# Patient Record
Sex: Female | Born: 1975
Health system: Southern US, Community
[De-identification: ages and names within clinical notes are randomized; demographics above are authoritative.]

## PROBLEM LIST (undated history)

## (undated) DIAGNOSIS — F419 Anxiety disorder, unspecified: Secondary | ICD-10-CM

## (undated) DIAGNOSIS — R51 Headache: Secondary | ICD-10-CM

## (undated) DIAGNOSIS — E282 Polycystic ovarian syndrome: Secondary | ICD-10-CM

## (undated) DIAGNOSIS — K8020 Calculus of gallbladder without cholecystitis without obstruction: Secondary | ICD-10-CM

## (undated) DIAGNOSIS — K219 Gastro-esophageal reflux disease without esophagitis: Secondary | ICD-10-CM

## (undated) DIAGNOSIS — Z87442 Personal history of urinary calculi: Secondary | ICD-10-CM

## (undated) DIAGNOSIS — G8929 Other chronic pain: Secondary | ICD-10-CM

## (undated) DIAGNOSIS — E079 Disorder of thyroid, unspecified: Secondary | ICD-10-CM

## (undated) HISTORY — DX: Gastro-esophageal reflux disease without esophagitis: K21.9

## (undated) HISTORY — DX: Headache: R51

## (undated) HISTORY — DX: Calculus of gallbladder without cholecystitis without obstruction: K80.20

## (undated) HISTORY — DX: Polycystic ovarian syndrome: E28.2

## (undated) HISTORY — DX: Disorder of thyroid, unspecified: E07.9

## (undated) HISTORY — DX: Personal history of urinary calculi: Z87.442

## (undated) HISTORY — DX: Other chronic pain: G89.29

## (undated) HISTORY — DX: Anxiety disorder, unspecified: F41.9

## (undated) HISTORY — PX: CHOLECYSTECTOMY: SHX55

---

## 1997-12-29 ENCOUNTER — Encounter: Admission: RE | Admit: 1997-12-29 | Discharge: 1998-03-29 | Payer: Self-pay | Admitting: Family Medicine

## 1998-11-23 ENCOUNTER — Other Ambulatory Visit: Admission: RE | Admit: 1998-11-23 | Discharge: 1998-11-23 | Payer: Self-pay | Admitting: Obstetrics and Gynecology

## 1998-11-30 ENCOUNTER — Ambulatory Visit (HOSPITAL_COMMUNITY): Admission: RE | Admit: 1998-11-30 | Discharge: 1998-11-30 | Payer: Self-pay | Admitting: *Deleted

## 1998-11-30 ENCOUNTER — Encounter: Payer: Self-pay | Admitting: *Deleted

## 1998-12-12 ENCOUNTER — Ambulatory Visit (HOSPITAL_COMMUNITY): Admission: RE | Admit: 1998-12-12 | Discharge: 1998-12-12 | Payer: Self-pay | Admitting: *Deleted

## 2001-03-25 ENCOUNTER — Other Ambulatory Visit: Admission: RE | Admit: 2001-03-25 | Discharge: 2001-03-25 | Payer: Self-pay | Admitting: Obstetrics and Gynecology

## 2002-06-04 ENCOUNTER — Other Ambulatory Visit: Admission: RE | Admit: 2002-06-04 | Discharge: 2002-06-04 | Payer: Self-pay | Admitting: Obstetrics and Gynecology

## 2002-09-10 ENCOUNTER — Emergency Department (HOSPITAL_COMMUNITY): Admission: EM | Admit: 2002-09-10 | Discharge: 2002-09-10 | Payer: Self-pay | Admitting: Emergency Medicine

## 2002-09-10 ENCOUNTER — Encounter: Payer: Self-pay | Admitting: Emergency Medicine

## 2003-06-16 ENCOUNTER — Other Ambulatory Visit: Admission: RE | Admit: 2003-06-16 | Discharge: 2003-06-16 | Payer: Self-pay | Admitting: Obstetrics and Gynecology

## 2004-06-21 ENCOUNTER — Other Ambulatory Visit: Admission: RE | Admit: 2004-06-21 | Discharge: 2004-06-21 | Payer: Self-pay | Admitting: Obstetrics and Gynecology

## 2005-05-21 ENCOUNTER — Other Ambulatory Visit: Admission: RE | Admit: 2005-05-21 | Discharge: 2005-05-21 | Payer: Self-pay | Admitting: Gynecology

## 2005-09-27 ENCOUNTER — Other Ambulatory Visit: Admission: RE | Admit: 2005-09-27 | Discharge: 2005-09-27 | Payer: Self-pay | Admitting: Obstetrics and Gynecology

## 2005-11-21 ENCOUNTER — Ambulatory Visit (HOSPITAL_COMMUNITY): Admission: RE | Admit: 2005-11-21 | Discharge: 2005-11-21 | Payer: Self-pay | Admitting: Obstetrics and Gynecology

## 2005-11-21 ENCOUNTER — Emergency Department (HOSPITAL_COMMUNITY): Admission: EM | Admit: 2005-11-21 | Discharge: 2005-11-21 | Payer: Self-pay | Admitting: Emergency Medicine

## 2006-06-23 ENCOUNTER — Emergency Department (HOSPITAL_COMMUNITY): Admission: EM | Admit: 2006-06-23 | Discharge: 2006-06-23 | Payer: Self-pay | Admitting: Family Medicine

## 2006-08-06 ENCOUNTER — Ambulatory Visit: Payer: Self-pay | Admitting: Gastroenterology

## 2006-12-26 ENCOUNTER — Emergency Department (HOSPITAL_COMMUNITY): Admission: EM | Admit: 2006-12-26 | Discharge: 2006-12-26 | Payer: Self-pay | Admitting: Emergency Medicine

## 2007-03-04 ENCOUNTER — Encounter: Admission: RE | Admit: 2007-03-04 | Discharge: 2007-03-04 | Payer: Self-pay | Admitting: Obstetrics and Gynecology

## 2008-03-30 ENCOUNTER — Inpatient Hospital Stay (HOSPITAL_COMMUNITY): Admission: AD | Admit: 2008-03-30 | Discharge: 2008-05-18 | Payer: Self-pay | Admitting: Obstetrics and Gynecology

## 2008-04-27 ENCOUNTER — Encounter: Payer: Self-pay | Admitting: Obstetrics and Gynecology

## 2008-05-04 ENCOUNTER — Encounter: Payer: Self-pay | Admitting: Obstetrics and Gynecology

## 2008-05-11 ENCOUNTER — Encounter: Payer: Self-pay | Admitting: Obstetrics and Gynecology

## 2008-05-18 ENCOUNTER — Encounter: Payer: Self-pay | Admitting: Obstetrics and Gynecology

## 2008-05-25 ENCOUNTER — Ambulatory Visit (HOSPITAL_COMMUNITY): Admission: RE | Admit: 2008-05-25 | Discharge: 2008-05-25 | Payer: Self-pay | Admitting: Obstetrics and Gynecology

## 2008-06-02 ENCOUNTER — Inpatient Hospital Stay (HOSPITAL_COMMUNITY): Admission: RE | Admit: 2008-06-02 | Discharge: 2008-06-06 | Payer: Self-pay | Admitting: Obstetrics and Gynecology

## 2008-06-02 ENCOUNTER — Encounter (INDEPENDENT_AMBULATORY_CARE_PROVIDER_SITE_OTHER): Payer: Self-pay | Admitting: Obstetrics and Gynecology

## 2010-07-08 ENCOUNTER — Encounter: Payer: Self-pay | Admitting: Obstetrics and Gynecology

## 2010-10-30 NOTE — Op Note (Signed)
Vanessa [name], Vanessa [name]             ACCOUNT NO.:  [id number]   MEDICAL RECORD NO.:  [id number]          PATIENT TYPE:  INP   LOCATION:  9139                          FACILITY:  [facility]   PHYSICIAN:  Michelle L. Grewal, M.D.DATE OF BIRTH:  September 01, 1975   DATE OF PROCEDURE:  DATE OF DISCHARGE:                               OPERATIVE REPORT   PREOPERATIVE DIAGNOSES:  Intrauterine pregnancy at 36 weeks, twins, twin  A breech, and advanced dilation.   POSTOPERATIVE DIAGNOSES:  Intrauterine pregnancy at 36 weeks, twins,  twin A breech, and advanced dilation.   PROCEDURE:  Primary low-transverse cesarean section.   SURGEON:  Michelle L. Vincente Poli, MD   ASSISTANT:  Duke Salvia. Marcelle Overlie, MD   ANESTHESIA:  Spinal.   ESTIMATED BLOOD LOSS:  500 mL.   COMPLICATIONS:  None.   SPECIMENS:  Placenta sent to Pathology x2.   PROCEDURE:  The patient is a 35 year old gravida 1, para 0 at 36 weeks'  gestation.  She conceived by IVF.  We have been following her for  preterm dilation.  She was in the hospital for an extensive period of  time because of advanced cervical dilation.  On Monday, she presented to  the office.  She was about 3-4 cm dilated with a breech very low in the  pelvis.  We discussed her options and because of her morbid obesity, we  elected to schedule C-section.  The patient presented today for  scheduled C-section.  She reports that this morning she passed a mucus  plug and noticed intense contractions and more pressure.   Exam today, after she received her spinal, she was actually 5 cm plus  with a bulging bag.   The patient was taken to the operating room and she was prepped and  draped in usual sterile fashion.  Spinal was placed without incident  initially without any problem.  A Foley catheter was placed and drained  clear urine.  Her pannus was taped up using, prior to prepping, the  usual surgical tape.  With good exposure, we made a low transverse  incision and carried that  down through the subcutaneous layer and  through the fascia.  Fascia scored in midline and extended laterally.  The rectus muscles were separated in the midline and the peritoneum was  entered bluntly.  The peritoneal incision was then stretched.  The lower  uterine segment was identified.  It was very thinned out.  Low-  transverse incision was made in the uterus.  It was scored one time and  then entered easily with a hemostat.  The amniotic fluid was clear.  The  baby was in frank breech and was delivered.  There was some difficulty  because the baby's head was hyperextended; however, we were able to  rotate the baby's head easily and then deliver the baby.  The baby was a  female infant, Apgar 7 at one minute and 8 at five minutes.  The cord  was clamped and cut and the baby was taken to the neonatal team.  I then  pushed down on the uterus and the sac for B  was bulging up near the  incision and around the sac, the fluid was clear.  The baby was in  vertex position and vacuum was applied easily to the head and the baby  was delivered easily with one pull of the vacuum, no pop-off.  The baby  was a female infant.  Apgars 7 at one minute and 8 at five minutes.  The  cord was clamped and cut.  The placentas were manually removed after  cord blood was obtained and cord pH was obtained and they were noted to  be unremarkable and they were sent to Pathology.  The uterus was  exteriorized and cleared of all clots and debris.  The uterine incision  was closed in 2 layers using 0-chromic in a running locked stitch.  The  uterus was returned to the abdomen.  Irrigation was performed and the  incision was inspected and noted be hemostatic.  The peritoneum was  closed using 0-Vicryl.  The rectus muscles were reapproximated using 0-  Vicryl.  The fascia was closed using 0-Vicryl in a running stitch.  After irrigation of subcutaneous layer, the subcutaneous layer was  closed with plain gut suture  interrupted and the skin was closed with  staples.  All sponge, lap, and instrument counts were correct x2.  The  patient went to recovery room in stable condition.      Michelle L. Vincente Poli, M.D.  Electronically Signed     MLG/MEDQ  D:  06/02/2008  T:  06/03/2008  Job:  [id number]

## 2010-10-30 NOTE — H&P (Signed)
NAME:  Vanessa [name], Vanessa [name]            ACCOUNT NO.:  [id number]   MEDICAL RECORD NO.:  [id number]          PATIENT TYPE:   LOCATION:                                 FACILITY:   PHYSICIAN:  Duke Salvia. Marcelle Overlie, M.D.    DATE OF BIRTH:   DATE OF ADMISSION:  DATE OF DISCHARGE:                              HISTORY & PHYSICAL   CHIEF COMPLAINT:  Preterm labor.   HISTORY OF PRESENT ILLNESS:  A 34 year old G1 p.o. was evaluated in the  office earlier today at 26-6/7 weeks for routine ultrasound was noted to  have no measurable cervix with baby A presenting in the breech  presentation, baby B in the vertex presentation with normal fluid.  A  and B  admitted now for tocolytics bedrest and betamethasone.   This is an IVF pregnancy.  She has otherwise had a normal pregnancy.  Today, blood type is B+.  One-hour GTT was 98.  Her March 17, 2008,  ultrasound with showed cervical length 4.7 cm no funneling noted.   PAST MEDICAL HISTORY:  Please see the hospital form for details.   PHYSICAL EXAMINATION:  VITAL SIGNS:  Temperature 98.2, blood pressure  120/78.  HEENT:  Unremarkable.  NECK:  Supple without masses.  LUNGS:  Clear.  CARDIOVASCULAR:  Rate and rhythm without murmurs, rubs or gallops.  BREASTS:  Not examined.  ABDOMEN:  Fundal height 30 cm.  Fetal heart rate A and B and the 140s.  Clinically, cervix was 2-3 cm.  The membrane was at the os, no pooling  of fluid noted vaginally.   IMPRESSION:  1. A 26-6/7 week IUP.  2. Breech/vertex presentation.  3. Preterm labor.   PLAN:  Will admit for betamethasone.  Vaginal culture for GBS.  Will  start antibiotics and magnesium sulfate, tocolysis.      Richard M. Marcelle Overlie, M.D.  Electronically Signed     RMH/MEDQ  D:  03/30/2008  T:  03/30/2008  Job:  [id number]   cc:   Labor and Delivery

## 2010-10-30 NOTE — Discharge Summary (Signed)
NAMEJALIAH, Vanessa [name]             ACCOUNT NO.:  [id number]   MEDICAL RECORD NO.:  [id number]          PATIENT TYPE:  INP   LOCATION:  9153                          FACILITY:  [facility]   PHYSICIAN:  Guy Sandifer. Henderson Cloud, M.D. DATE OF BIRTH:  01/08/1976   DATE OF ADMISSION:  03/30/2008  DATE OF DISCHARGE:  05/18/2008                               DISCHARGE SUMMARY   ADMITTING DIAGNOSES:  1. Intrauterine pregnancy at 28 and 6/7th weeks.  2. Twins, breech vertex presentation.  3. Preterm labor.   DISCHARGE DIAGNOSES:  1. Intrauterine pregnancy at 70 and 6/7th weeks' estimated gestational      age.  2. Twins breech-oblique presentation.  3. Preterm labor.   REASON FOR ADMISSION:  This patient is a 35 year old married white  female G1, P0 who on routine evaluation in the office was noted to have  no measurable cervix with baby A in a breech presentation.  She was  transferred to [facility] for further evaluation and treatment.   HOSPITAL COURSE:  The patient was admitted to the hospital where she was  placed on bedrest.  She was started on magnesium sulfate, tocolysis, and  given betamethasone.  Maternal fetal medicine consultation was obtained.  The impression was cervical shortening with a clinically mixed picture,  probably a component of cervical insufficiency with the preterm labor.  The patient had a polycystic ovarian syndrome and was on metformin at  the time of admission.  Magnesium sulfate was continued 24 hours after  the second dose of betamethasone.  She was started on oral Prometrium.  Antibiotics were continued intravenously for 7 days.  She continued to  have some contractions and the magnesium sulfate was actually continued.  Ultrasound on April 06, 2008, revealed funneling of the cervix with 4  mm measurable close to length of the cervix.  Magnesium sulfate was  therefore continued.  Unasyn was discontinued on April 07, 2008.  The  same regimen of treatment was  continued.  Repeat ultrasound on April 13, 2008, revealed a cervix measuring 8 mm with no significant change.  Funneling was still present at the internal os.  Repeat evaluation by  Maternal Fetal Medicine was obtained on April 14, 2008.  That  recommended discontinuation of magnesium and starting oral Procardia.  The patient was therefore, started on Procardia 10 mg q.6 h.  Progesterone was continued as well.  Repeat ultrasound on April 22, 2008, was consistent with breech-breech presentation.  Adequate growth  was noted.  She was continued on the same treatment regimen.  Due to the  patient's thin funneling cervix, recommendation was made to continue  hospitalization until 34 weeks.  On the day of discharge, ultrasound  reveals a baby A to be breech and baby B to be oblique.  Adequate fluid  was noted around both babies.  Recommendation with Maternal Fetal  Medicine was discharge home.   CONDITION ON DISCHARGE:  Stable.   DIET:  Regular as tolerated.   MEDICATIONS:  1. Procardia 10 mg q.6 h.  2. Flexeril 10 mg b.i.d. p.r.n.  3. Protonix 40 mg daily.  4. Magic mouthwash 5 mL q.i.d.  5. Prometrium 20 mg at bedtime.  6. Vicodin 1-2 q.8 h. p.r.n. back pain.   FOLLOWUP:  With Maternal Fetal Medicine with ultrasound in 1 week in our  office this coming Monday.      Guy Sandifer Henderson Cloud, M.D.  Electronically Signed     JET/MEDQ  D:  05/18/2008  T:  05/19/2008  Job:  [id number]

## 2010-11-02 NOTE — Assessment & Plan Note (Signed)
[facility]                         GASTROENTEROLOGY OFFICE NOTE   Vanessa [name], Vanessa [name]                      MRN:          [id number]  DATE:08/06/2006                            DOB:          08-07-1975    PROBLEM:  Dysphagia.   Vanessa [name] is a pleasant 35 year old white female complaining of  intermittent dysphagia.  She has suffered from dysphagia with solids  when she has eaten quickly.  She also feels like there is food tickling  her throat postprandially.  She denies pyrosis, per say.  There is no  history of cough, hoarseness of sore throat.   PAST MEDICAL HISTORY:  Pertinent for kidney stones.  She is status post  tonsillectomy.  Pertinent for diabetes.   FAMILY HISTORY:  Noncontributory.   MEDICATIONS:  Include metformin and a multivitamin.   SHE HAS NO ALLERGIES:   She neither smokes or drinks.  She is married, and works for a [employer].   REVIEW OF SYSTEMS:  Reviewed, and is pertinent for a transient pain  under her right breast.   EXAMINATION:  Pulse 76.  Blood pressure 120/82.  Weight 282.  HEENT: EOMI. PERRLA. Sclerae are anicteric.  Conjunctivae are pink.  NECK:  Supple without thyromegaly, adenopathy or carotid bruits.  CHEST:  Clear to auscultation and percussion without adventitious  sounds.  CARDIAC:  Regular rhythm; normal S1 S2.  There are no murmurs, gallops  or rubs.  ABDOMEN:  Bowel sounds are normoactive.  Abdomen is soft, non-tender and  non-distended.  There are no abdominal masses, tenderness, splenic  enlargement or hepatomegaly.  EXTREMITIES:  Full range of motion.  No cyanosis, clubbing or edema.  RECTAL:  Deferred.   IMPRESSION:  1. Dysphagia.  Rule out early esophageal stricture.  2. Throat discomfort.  This could be a manifestation of      gastroesophageal reflux disease.   RECOMMENDATION:  1. Upper endoscopy with Savary dilatation as indicated.  2. A trial of Nexium 40 mg a day.     Barbette Hair. Arlyce Dice, MD,FACG  Electronically Signed    RDK/MedQ  DD: 08/06/2006  DT: 08/06/2006  Job #: [id number]   cc:   Marcelino Duster L. Vincente Poli, M.D.

## 2010-11-02 NOTE — Letter (Signed)
August 06, 2006    Michelle L. Vincente Poli, M.D.  [address]  [place], [place] [address]   RE:  [name], [name]  MRN:  [id number]  /  DOB:  06/01/1976   Dear Dr. Vincente Poli:   Upon your kind referral, I had the pleasure of evaluating your patient  and I am pleased to offer my findings.  I saw [name] in the  office today.  Enclosed is a copy of my progress note that details my  findings and recommendations.   Thank you for the opportunity to participate in your patient's care.    Sincerely,      Barbette Hair. Arlyce Dice, MD,FACG  Electronically Signed    RDK/MedQ  DD: 08/06/2006  DT: 08/06/2006  Job #: [id number]

## 2010-11-02 NOTE — Letter (Signed)
August 06, 2006    Vanessa [name]   RE:  [name], [name]  MRN:  [id number]  /  DOB:  21-Jan-1976   Dear [name]:   It is my pleasure to have treated you recently as a new patient in my  office.  I appreciate your confidence and the opportunity to participate  in your care.   Since I do have a busy inpatient endoscopy schedule and office schedule,  my office hours vary weekly.  I am, however, available for emergency  calls every day through my office.  If I cannot promptly meet an urgent  office appointment, another one of our gastroenterologists will be able  to assist you.   My well-trained staff are prepared to help you at all times.  For  emergencies after office hours, a physician from our gastroenterology  section is always available through my 24-hour answering service.   While you are under my care, I encourage discussion of your questions  and concerns, and I will be happy to return your calls as soon as I am  available.   Once again, I welcome you as a new patient and I look forward to a happy  and healthy relationship.    Sincerely,      Barbette Hair. Arlyce Dice, MD,FACG  Electronically Signed   RDK/MedQ  DD: 08/06/2006  DT: 08/06/2006  Job #: [id number]

## 2010-11-02 NOTE — Discharge Summary (Signed)
Vanessa [name], Vanessa [name]             ACCOUNT NO.:  [id number]   MEDICAL RECORD NO.:  [id number]          PATIENT TYPE:  INP   LOCATION:  9139                          [facility]   PHYSICIAN:  Guy Sandifer. Henderson Cloud, M.D. DATE OF BIRTH:  July 05, 1975   DATE OF ADMISSION:  06/02/2008  DATE OF DISCHARGE:  06/06/2008                               DISCHARGE SUMMARY   ADMITTING DIAGNOSES:  1. Intrauterine pregnancy at 36 weeks.  2. Twins.  3. Baby A was breech.  4. Advanced cervical dilation.   DISCHARGE DIAGNOSES:  1. Anterior pregnancy at 36 weeks.  2. Twins  3. Baby A was breech.  4. Advanced cervical dilation.   PROCEDURE:  On June 02, 2008, primary low transverse cesarean  section.   REASON FOR ADMISSION:  This patient is a 35 year old G1, P0, who was  found to be 4 cm dilated with a breech very low in the pelvis in the  office.  After discussion of options, she has taken to cesarean section  for advanced cervical dilation with a breech presentation.   HOSPITAL COURSE:  The patient undergoes the above procedure and  productive of twins.  Baby A is a viable female, Apgars of 7 and 8.  Baby B is a viable female, Apgars of 7 and 8.  On the first postoperative  day, she is doing well.  Having resumption of bowel function and  ambulating.  Vital signs are stable.  She is afebrile.  Hemoglobin is  9.2.  Platelets are 109,000.  On the second postoperative day,  hemoglobin is 9.5 and platelets are 139,000.  Vital signs remained  stable and she has a good resumption of bowel function and she is  ambulating well.  She is discharged home on June 06, 2008.   CONDITION ON DISCHARGE:  Good.   DIET:  Regular as tolerated.   ACTIVITY:  No lifting, no operation of automobiles, and no vaginal  entry.  She is to call the office for problems including, but not  limited to temperature 101 degrees, persistent nausea, vomiting,  increasing pain, or heavy bleeding.   MEDICATIONS:  1.  Prenatal vitamin daily.  2. Percocet 5/325 mg, #30 one to two q.6 h. p.r.n.   Follow up is in the office in 2 weeks.      Guy Sandifer Henderson Cloud, M.D.  Electronically Signed     JET/MEDQ  D:  07/21/2008  T:  07/21/2008  Job:  [id number]

## 2011-03-19 LAB — RPR: RPR Ser Ql: NONREACTIVE

## 2011-03-19 LAB — GLUCOSE, RANDOM: Glucose, Bld: 97 mg/dL (ref 70–99)

## 2011-03-19 LAB — CBC
Hemoglobin: 10.1 g/dL — ABNORMAL LOW (ref 12.0–15.0)
MCHC: 34.1 g/dL (ref 30.0–36.0)
MCHC: 33 g/dL (ref 30.0–36.0)
MCV: 96.1 fL (ref 78.0–100.0)
RBC: 3.18 MIL/uL — ABNORMAL LOW (ref 3.87–5.11)
RDW: 14.2 % (ref 11.5–15.5)

## 2011-03-19 LAB — MAGNESIUM: Magnesium: 5.4 mg/dL — ABNORMAL HIGH (ref 1.5–2.5)

## 2011-03-19 LAB — GLUCOSE, CAPILLARY: Glucose-Capillary: 80 mg/dL (ref 70–99)

## 2011-03-22 LAB — CBC
HCT: 26 % — ABNORMAL LOW (ref 36.0–46.0)
HCT: 28.1 % — ABNORMAL LOW (ref 36.0–46.0)
HCT: 32.3 % — ABNORMAL LOW (ref 36.0–46.0)
Hemoglobin: 10.9 g/dL — ABNORMAL LOW (ref 12.0–15.0)
Hemoglobin: 9 g/dL — ABNORMAL LOW (ref 12.0–15.0)
Hemoglobin: 9.5 g/dL — ABNORMAL LOW (ref 12.0–15.0)
MCHC: 33.7 g/dL (ref 30.0–36.0)
MCHC: 33.7 g/dL (ref 30.0–36.0)
MCV: 95.7 fL (ref 78.0–100.0)
RBC: 3.38 MIL/uL — ABNORMAL LOW (ref 3.87–5.11)
RDW: 15.2 % (ref 11.5–15.5)
RDW: 15.2 % (ref 11.5–15.5)
RDW: 15.6 % — ABNORMAL HIGH (ref 11.5–15.5)
WBC: 8.5 10*3/uL (ref 4.0–10.5)

## 2011-04-02 LAB — URINALYSIS, ROUTINE W REFLEX MICROSCOPIC
Bilirubin Urine: NEGATIVE
Protein, ur: NEGATIVE
Urobilinogen, UA: 0.2

## 2011-04-02 LAB — URINE MICROSCOPIC-ADD ON

## 2011-10-07 ENCOUNTER — Other Ambulatory Visit: Payer: Self-pay | Admitting: Obstetrics and Gynecology

## 2011-10-14 ENCOUNTER — Encounter: Payer: Self-pay | Admitting: Gastroenterology

## 2011-10-15 ENCOUNTER — Telehealth: Payer: Self-pay | Admitting: Gastroenterology

## 2011-10-15 NOTE — Telephone Encounter (Signed)
Pt states she is having problems with reflux and thinks she may need to have another EGD. Offered pt an appt with [facility] but pt states she wants to see Dr. Arlyce Dice. Pt scheduled to see Dr. Arlyce Dice 11/12/11@9 :30am. Pt aware of appt date and time.

## 2011-10-30 ENCOUNTER — Ambulatory Visit (INDEPENDENT_AMBULATORY_CARE_PROVIDER_SITE_OTHER): Payer: Managed Care, Other (non HMO) | Admitting: Gastroenterology

## 2011-10-30 ENCOUNTER — Encounter: Payer: Self-pay | Admitting: Internal Medicine

## 2011-10-30 ENCOUNTER — Encounter: Payer: Self-pay | Admitting: Gastroenterology

## 2011-10-30 VITALS — BP 124/68 | HR 76 | Ht 64.0 in | Wt 325.0 lb

## 2011-10-30 DIAGNOSIS — E669 Obesity, unspecified: Secondary | ICD-10-CM

## 2011-10-30 DIAGNOSIS — R1310 Dysphagia, unspecified: Secondary | ICD-10-CM | POA: Insufficient documentation

## 2011-10-30 MED ORDER — SUCRALFATE 1 GM/10ML PO SUSP: 1.0000 g | Freq: Four times a day (QID) | ORAL | Status: DC

## 2011-10-30 NOTE — Assessment & Plan Note (Addendum)
This may be due to esophageal ulcer or esophagitis. She takes NSAIDs regularly which may be contributing to her symptoms.  Recommendations #1 upper endoscopy #2 add Carafate slurry

## 2011-10-30 NOTE — Progress Notes (Signed)
History of Present Illness: Vanessa [name] is a pleasant 36 year old white female referred at the request of Dr. Thana Ates for evaluation of chest pain. This has occurred for years for which she takes omeprazole. Within seconds of swallowing she may experience severe chest pain that lasts up to 20 seconds. Pain is incapacitating. She denies pyrosis or dysphagia. For the past 8 months she has been taking a nonsteroidal for hip bursitis. She denies sore throat, hoarseness or coughing.    Past Medical History  Diagnosis Date  . GERD (gastroesophageal reflux disease)   . Anxiety   . Chronic headaches   . Thyroid disease   . History of kidney stones   . PCOS (polycystic ovarian syndrome)     Takes metformin    Past Surgical History  Procedure Date  . Cesarean section    family history includes Crohn's disease in her brother and Irritable bowel syndrome in her mother.  There is no history of Colon cancer. Current Outpatient Prescriptions  Medication Sig Dispense Refill  . ALPRAZolam (XANAX) 0.5 MG tablet Takes one and a half at bedtime      . cholecalciferol (VITAMIN D) 1000 UNITS tablet Take 1,000 Units by mouth daily.      . Levothyroxine Sodium (SYNTHROID PO) Take 1 tablet by mouth daily.      . metFORMIN (GLUCOPHAGE-XR) 500 MG 24 hr tablet One tablet by mouth once daily      . Omega-3 Fatty Acids (FISH OIL) 1000 MG CAPS Take 1 capsule by mouth daily.      Marland Kitchen omeprazole (PRILOSEC) 40 MG capsule One tablet by mouth once daily      . Prenatal Vit-Fe Fumarate-FA (PRENATAL PO) Take 2 tablets by mouth daily.      . vitamin C (ASCORBIC ACID) 500 MG tablet Take 500 mg by mouth daily.       Allergies as of 10/30/2011  . (No Known Allergies)    reports that she has never smoked. She has never used smokeless tobacco. She reports that she does not drink alcohol or use illicit drugs.     Review of Systems: Pertinent positive and negative review of systems were noted in the above HPI section. All  other review of systems were otherwise negative.  Vital signs were reviewed in today's medical record Physical Exam: General: Well developed , well nourished, no acute distress Head: Normocephalic and atraumatic Eyes:  sclerae anicteric, EOMI Ears: Normal auditory acuity Mouth: No deformity or lesions Neck: Supple, no masses or thyromegaly Lungs: Clear throughout to auscultation Heart: Regular rate and rhythm; no murmurs, rubs or bruits Abdomen: Soft, non tender and non distended. No masses, hepatosplenomegaly or hernias noted. Normal Bowel sounds Rectal:deferred Musculoskeletal: Symmetrical with no gross deformities  Skin: No lesions on visible extremities Pulses:  Normal pulses noted Extremities: No clubbing, cyanosis, edema or deformities noted Neurological: Alert oriented x 4, grossly nonfocal Cervical Nodes:  No significant cervical adenopathy Inguinal Nodes: No significant inguinal adenopathy Psychological:  Alert and cooperative. Normal mood and affect

## 2011-10-30 NOTE — Patient Instructions (Signed)
You have been scheduled for an endoscopy with propofol. Please follow the written instructions given to you at your visit today.

## 2011-10-30 NOTE — Assessment & Plan Note (Addendum)
The patient is morbidly obese. Several mitigating factors include the recent loss of a child. We discussed strategies for weight loss including bariatric surgery which I encouraged her to seek information about.

## 2011-11-06 ENCOUNTER — Other Ambulatory Visit: Payer: Self-pay | Admitting: Dermatology

## 2011-11-12 ENCOUNTER — Telehealth: Payer: Self-pay | Admitting: Gastroenterology

## 2011-11-12 ENCOUNTER — Ambulatory Visit: Payer: Self-pay | Admitting: Gastroenterology

## 2011-11-12 NOTE — Telephone Encounter (Signed)
Pt is scheduled for EGD at 4pm tomorrow. Pt wants to know if she can take a xanax prior to her procedure to help her relax. If so what time should she take it since the appt is at 4pm. Please advise.

## 2011-11-12 NOTE — Telephone Encounter (Signed)
Spoke with pt and she is aware.

## 2011-11-12 NOTE — Telephone Encounter (Signed)
Take it at 3pm

## 2011-11-13 ENCOUNTER — Ambulatory Visit (AMBULATORY_SURGERY_CENTER): Payer: Managed Care, Other (non HMO) | Admitting: Gastroenterology

## 2011-11-13 ENCOUNTER — Other Ambulatory Visit: Payer: Self-pay | Admitting: Gastroenterology

## 2011-11-13 ENCOUNTER — Encounter: Payer: Self-pay | Admitting: Gastroenterology

## 2011-11-13 VITALS — BP 122/77 | HR 100 | Temp 98.6°F | Resp 20 | Ht 64.0 in | Wt 325.0 lb

## 2011-11-13 DIAGNOSIS — R1013 Epigastric pain: Secondary | ICD-10-CM

## 2011-11-13 DIAGNOSIS — R1310 Dysphagia, unspecified: Secondary | ICD-10-CM

## 2011-11-13 MED ORDER — GLYCOPYRROLATE 2 MG PO TABS: 2.0000 mg | ORAL_TABLET | Freq: Two times a day (BID) | ORAL | Status: DC

## 2011-11-13 MED ORDER — SODIUM CHLORIDE 0.9 % IV SOLN: 500.0000 mL | INTRAVENOUS | Status: DC

## 2011-11-13 NOTE — Op Note (Signed)
[facility] [address] [place], [place]  [address]  ENDOSCOPY PROCEDURE REPORT  PATIENT:  Vanessa [name], Vanessa [name]  MR#:  [id number] BIRTHDATE:  [birth year], 35 yrs. old  GENDER:  female  ENDOSCOPIST:  Barbette Hair. Arlyce Dice, MD Referred by:  Marcelle Overlie, M.D.  PROCEDURE DATE:  11/13/2011 PROCEDURE:  EGD, diagnostic 43235 ASA CLASS:  Class II INDICATIONS:  odynophagia  MEDICATIONS:   MAC sedation, administered by CRNA propofol 110mg IV , glycopyrrolate (Robinal) 0.2 mg IV, 0.6cc simethancone 0.6 cc PO TOPICAL ANESTHETIC:  DESCRIPTION OF PROCEDURE:   After the risks and benefits of the procedure were explained, informed consent was obtained.  The LB [id number]-H180 [id number] endoscope was introduced through the mouth and advanced to the third portion of the duodenum.  The instrument was slowly withdrawn as the mucosa was fully examined. <<PROCEDUREIMAGES>>  The upper, middle, and distal third of the esophagus were carefully inspected and no abnormalities were noted. The z-line was well seen at the GEJ. The endoscope was pushed into the fundus which was normal including a retroflexed view. The antrum,gastric body, first and second part of the duodenum were unremarkable (see image1, image2, image4, image5, and image7).    Retroflexed views revealed no abnormalities.    The scope was then withdrawn from the patient and the procedure completed.  COMPLICATIONS:  None  ENDOSCOPIC IMPRESSION: 1) Normal EGD RECOMMENDATIONS: 1) CT Scan of chest 2) d/c carafate; begin robinul forte 3) OV 2-3 weeks  ______________________________ Barbette Hair. Arlyce Dice, MD  CC:  n. eSIGNED:   Barbette Hair. Brydan Downard at 11/13/2011 04:29 PM  Wiliam Ke, [phone]

## 2011-11-13 NOTE — Patient Instructions (Signed)

## 2011-11-13 NOTE — Progress Notes (Signed)
Patient did not experience any of the following events: a burn prior to discharge; a fall within the facility; wrong site/side/patient/procedure/implant event; or a hospital transfer or hospital admission upon discharge from the facility. (G8907) Patient did not have preoperative order for IV antibiotic SSI prophylaxis. (G8918)  

## 2011-11-14 ENCOUNTER — Telehealth: Payer: Self-pay

## 2011-11-14 NOTE — Telephone Encounter (Signed)
Left message on answering machine. 

## 2011-11-15 ENCOUNTER — Other Ambulatory Visit: Payer: Self-pay | Admitting: Gastroenterology

## 2011-11-15 ENCOUNTER — Telehealth: Payer: Self-pay

## 2011-11-15 DIAGNOSIS — R1013 Epigastric pain: Secondary | ICD-10-CM

## 2011-11-15 NOTE — Telephone Encounter (Signed)
Pt scheduled for chest xray today at [facility]. Pt aware and knows to come for xray.    Please order a chest x-ray    ----- Message -----    From: Lily Lovings, RN    Sent: 11/15/2011   9:37 AM      To: Louis Meckel, MD Subject: FW: CT Denied                                  Dr. Arlyce Dice the insurance company will not pay for her to have a CT chest. Please advise. See note below for more info.  Bonita Quin  ----- Message -----    From: Robbi Garter    Sent: 11/15/2011   7:26 AM      To: Lily Lovings, RN Subject: CT Denied                                      [employer] (for [facility]) denied her CT chest. Stated the clinical did not meet their guidelines (I submitted both the last office note and EGD) and no prior xray done. [employer] does not have an option for Dr. Arlyce Dice to call or appeal.  I        Referral   Referral # [id number]       Referral Information         Referral # Creation Date Referral Status Status Update    [id number] 11/13/2011 Denied 11/15/2011:Status History.                Status Reason Referral Type Referral Reason Referral Class    Does Not Meet 3rd Party Guidelines MRI/CAT Scan/Imaging none Internal                To Specialty To Provider To Location/POS To Department    Radiology none none none                Vendor Referred By By Location/POS By Department    none none [facility]                Priority Start Date Expiration Date Referral Entered By    Routine 11/13/2011 05/11/2012 Marcella Charlson R                Visits Requested Visits Authorized Visits Completed Visits Scheduled    1 0   1           Procedure Information         Procedure Modifiers Provider Requested Approved    [id number] - CT Chest Limited W/Cm     1 0           Diagnosis Information         Diagnosis    789.06 (ICD-9-CM) - Epigastric pain           Referral Notes         Type Date User    General  11/15/2011 10:28 AM Melvia Heaps D             Summary        Auto: Referral message                 Note        ----- Message -----  From: Louis Meckel, MD           Sent: 11/15/2011  10:28 AM             To: Lily Lovings, RN        Subject: RE: CT Denied                                                 Please order a chest x-ray                 ----- Message -----           From: Lily Lovings, RN           Sent: 11/15/2011   9:37 AM             To: Louis Meckel, MD        Subject: FW: CT Denied                                                 Dr. Arlyce Dice the insurance company will not pay for her to have a CT chest. Please advise. See note below for more info.                 Bonita Quin                 ----- Message -----           From: Robbi Garter           Sent: 11/15/2011   7:26 AM             To: Lily Lovings, RN        Subject: CT Denied                                                     [employer] (for [facility]) denied her CT chest. Stated the clinical did not meet their guidelines (I submitted both the last office note and EGD) and no prior xray done. [employer] does not have an option for Dr. Arlyce Dice to call or appeal.                 I                                                   Type Date User    General 11/15/2011 10:28 AM Melvia Heaps D             Summary        Auto: Referral message                 Note        ----- Message -----           From: Louis Meckel, MD           Sent: 11/15/2011  10:27 AM  To: Lily Lovings, RN        Subject: RE: CT Denied                                                 Please order a chest x-ray                 ----- Message -----           From: Lily Lovings, RN           Sent: 11/15/2011   9:37 AM             To: Louis Meckel, MD        Subject: FW: CT Denied                                                 Dr. Arlyce Dice the insurance company will not  pay for her to have a CT chest. Please advise. See note below for more info.                 Bonita Quin                 ----- Message -----           From: Robbi Garter           Sent: 11/15/2011   7:26 AM             To: Lily Lovings, RN        Subject: CT Denied                                                     [employer] (for [facility]) denied her CT chest. Stated the clinical did not meet their guidelines (I submitted both the last office note and EGD) and no prior xray done. [employer] does not have an option for Dr. Arlyce Dice to call or appeal.                 I                                                   Type Date User    General 11/15/2011  9:38 AM Tessia Kassin R             Summary        Auto: Referral message                 Note        ----- Message -----           From: Lily Lovings, RN           Sent: 11/15/2011   9:37 AM             To: Louis Meckel, MD        Subject: FW: CT Denied  Dr. Arlyce Dice the insurance company will not pay for her to have a CT chest. Please advise. See note below for more info.                 Bonita Quin                 ----- Message -----           From: Robbi Garter           Sent: 11/15/2011   7:26 AM             To: Lily Lovings, RN        Subject: CT Denied                                                     [employer] (for [facility]) denied her CT chest. Stated the clinical did not meet their guidelines (I submitted both the last office note and EGD) and no prior xray done. [employer] does not have an option for Dr. Arlyce Dice to call or appeal.                 I                                          Type Date User    General 11/15/2011  7:28 AM DEMPSEY, Danella Maiers             Summary        Auto: Referral message                 Note        ----- Message -----           From: Robbi Garter           Sent: 11/15/2011   7:26 AM             To:  Lily Lovings, RN        Subject: CT Denied                                                     [employer] (for [facility]) denied her CT chest. Stated the clinical did not meet their guidelines (I submitted both the last office note and EGD) and no prior xray done. [employer] does not have an option for Dr. Arlyce Dice to call or appeal.                 I                                 Type Date User    General 11/14/2011  4:52 PM Louis Gaw R             Summary        Auto: Referral message                 Note        ----- Message -----  From: Lily Lovings, RN           Sent: 11/14/2011   4:51 PM             To: Robbi Garter        Subject: RE: CT Chest                                                  Scheduled for 11/21/11@10 :15am at [facility] [address]                 Thanks,        Bonita Quin                 ----- Message -----           From: Robbi Garter           Sent: 11/14/2011   8:18 AM             To: Lily Lovings, RN, Marlowe Kays, CMA        Subject: CT Chest                                                      Not sure who will be scheduling this exam since it was ordered from EGD...                 Either way, [employer] plan requests [facility] for location and yet still pended auth. Please schedule for next week, Thanks!                                          Type Date User    General 11/14/2011  8:21 AM DEMPSEY, Danella Maiers             Summary        Auto: Referral message                 Note        ----- Message -----           From: Robbi Garter           Sent: 11/14/2011   8:18 AM             To: Lily Lovings, RN, Marlowe Kays, CMA        Subject: CT Chest                                                      Not sure who will be scheduling this exam since it was ordered from EGD...                 Either way, [employer] plan requests [facility] for location and yet still pended auth. Please schedule for next week,  Thanks!               Referral Order  Order    CT Chest Limited W/Cm (Order # [id number]) on 11/13/2011      View Encounter

## 2011-11-18 ENCOUNTER — Telehealth: Payer: Self-pay | Admitting: Gastroenterology

## 2011-11-18 MED ORDER — DEXLANSOPRAZOLE 60 MG PO CPDR: 60.0000 mg | DELAYED_RELEASE_CAPSULE | Freq: Two times a day (BID) | ORAL | Status: DC

## 2011-11-18 NOTE — Telephone Encounter (Signed)
Pt states she has Dexilant 60mg  samples she is taking BID. Pt requesting that [facility]. Rx sent to pharmacy for pt.

## 2011-11-18 NOTE — Telephone Encounter (Signed)
Addended by: Selinda Michaels R on: 11/18/2011 11:16 AM   Modules accepted: Orders

## 2011-11-21 ENCOUNTER — Other Ambulatory Visit: Payer: Managed Care, Other (non HMO)

## 2011-11-22 ENCOUNTER — Telehealth: Payer: Self-pay | Admitting: *Deleted

## 2011-11-22 MED ORDER — LANSOPRAZOLE 30 MG PO CPDR: 30.0000 mg | DELAYED_RELEASE_CAPSULE | Freq: Every day | ORAL | Status: DC

## 2011-11-22 NOTE — Telephone Encounter (Signed)
IN PHONE WITH [facility] FOR 1 AND 1/2 HOURS. DENIED DEXILANT CALLED PATIENT TO INFORM, PT IS VERY UPSET. I AM SENDING HER A 14 DAY SUPPLY OF LANSOPRAZKLE FOR THE PATIENT AS PROOF FOR THE INSURANCE CO THAT SHE HAS TRIED ANOTHER PPI. THEY ALSO WANT HER TO TRY PROTONIX AND NEXIUM OR GENERIC ZEGERID BEFORE THEY WILL PAY FOR DEXILANT PATIENT COMING IN NEXT WEEK FOR A CHEST XRAY. WANT SAMPLES OF DEXILANT AND HER LETTER OF DENIAL.

## 2011-11-25 ENCOUNTER — Ambulatory Visit: Payer: Self-pay | Admitting: Gastroenterology

## 2011-12-06 ENCOUNTER — Ambulatory Visit: Payer: Managed Care, Other (non HMO) | Admitting: Gastroenterology

## 2012-10-05 DIAGNOSIS — E282 Polycystic ovarian syndrome: Secondary | ICD-10-CM | POA: Insufficient documentation

## 2012-11-02 ENCOUNTER — Other Ambulatory Visit: Payer: Self-pay | Admitting: Obstetrics and Gynecology

## 2012-12-09 ENCOUNTER — Other Ambulatory Visit: Payer: Self-pay | Admitting: Dermatology

## 2013-02-06 ENCOUNTER — Emergency Department (HOSPITAL_COMMUNITY): Payer: Managed Care, Other (non HMO)

## 2013-02-06 ENCOUNTER — Encounter (HOSPITAL_COMMUNITY): Payer: Self-pay | Admitting: *Deleted

## 2013-02-06 ENCOUNTER — Emergency Department (HOSPITAL_COMMUNITY)
Admission: EM | Admit: 2013-02-06 | Discharge: 2013-02-06 | Disposition: A | Discharge status: Home / Self Care | Payer: Managed Care, Other (non HMO) | Attending: Emergency Medicine | Admitting: Emergency Medicine

## 2013-02-06 DIAGNOSIS — R51 Headache: Secondary | ICD-10-CM | POA: Insufficient documentation

## 2013-02-06 DIAGNOSIS — Z87442 Personal history of urinary calculi: Secondary | ICD-10-CM | POA: Insufficient documentation

## 2013-02-06 DIAGNOSIS — R112 Nausea with vomiting, unspecified: Secondary | ICD-10-CM | POA: Insufficient documentation

## 2013-02-06 DIAGNOSIS — Z91040 Latex allergy status: Secondary | ICD-10-CM | POA: Insufficient documentation

## 2013-02-06 DIAGNOSIS — Z79899 Other long term (current) drug therapy: Secondary | ICD-10-CM | POA: Insufficient documentation

## 2013-02-06 DIAGNOSIS — R109 Unspecified abdominal pain: Secondary | ICD-10-CM

## 2013-02-06 DIAGNOSIS — E282 Polycystic ovarian syndrome: Secondary | ICD-10-CM | POA: Insufficient documentation

## 2013-02-06 DIAGNOSIS — Z8719 Personal history of other diseases of the digestive system: Secondary | ICD-10-CM | POA: Insufficient documentation

## 2013-02-06 DIAGNOSIS — G8929 Other chronic pain: Secondary | ICD-10-CM | POA: Insufficient documentation

## 2013-02-06 DIAGNOSIS — Z3202 Encounter for pregnancy test, result negative: Secondary | ICD-10-CM | POA: Insufficient documentation

## 2013-02-06 DIAGNOSIS — F411 Generalized anxiety disorder: Secondary | ICD-10-CM | POA: Insufficient documentation

## 2013-02-06 DIAGNOSIS — E079 Disorder of thyroid, unspecified: Secondary | ICD-10-CM | POA: Insufficient documentation

## 2013-02-06 LAB — CBC WITH DIFFERENTIAL/PLATELET
Eosinophils Absolute: 0.1 10*3/uL (ref 0.0–0.7)
Eosinophils Relative: 1 % (ref 0–5)
HCT: 40.8 % (ref 36.0–46.0)
Hemoglobin: 13.6 g/dL (ref 12.0–15.0)
Lymphocytes Relative: 37 % (ref 12–46)
Lymphs Abs: 3.6 10*3/uL (ref 0.7–4.0)
MCH: 31.4 pg (ref 26.0–34.0)
MCV: 94.2 fL (ref 78.0–100.0)
Monocytes Absolute: 0.5 10*3/uL (ref 0.1–1.0)
Monocytes Relative: 5 % (ref 3–12)
RBC: 4.33 MIL/uL (ref 3.87–5.11)
WBC: 9.6 10*3/uL (ref 4.0–10.5)

## 2013-02-06 LAB — COMPREHENSIVE METABOLIC PANEL
ALT: 48 U/L — ABNORMAL HIGH (ref 0–35)
BUN: 16 mg/dL (ref 6–23)
CO2: 29 mEq/L (ref 19–32)
Calcium: 9.6 mg/dL (ref 8.4–10.5)
Creatinine, Ser: 0.66 mg/dL (ref 0.50–1.10)
Glucose, Bld: 108 mg/dL — ABNORMAL HIGH (ref 70–99)
Total Protein: 7.2 g/dL (ref 6.0–8.3)

## 2013-02-06 LAB — URINALYSIS, ROUTINE W REFLEX MICROSCOPIC
Bilirubin Urine: NEGATIVE
Glucose, UA: NEGATIVE mg/dL
Ketones, ur: NEGATIVE mg/dL
Leukocytes, UA: NEGATIVE
pH: 7 (ref 5.0–8.0)

## 2013-02-06 MED ORDER — MORPHINE SULFATE 4 MG/ML IJ SOLN
6.0000 mg | Freq: Once | INTRAMUSCULAR | Status: AC
  Administered 2013-02-06: 6 mg via INTRAVENOUS
  Filled 2013-02-06: qty 2

## 2013-02-06 MED ORDER — PROMETHAZINE HCL 25 MG/ML IJ SOLN
25.0000 mg | Freq: Once | INTRAMUSCULAR | Status: AC
  Administered 2013-02-06: 25 mg via INTRAVENOUS
  Filled 2013-02-06: qty 1

## 2013-02-06 MED ORDER — SODIUM CHLORIDE 0.9 % IV SOLN
Freq: Once | INTRAVENOUS | Status: AC
  Administered 2013-02-06: 50 mL/h via INTRAVENOUS

## 2013-02-06 MED ORDER — IOHEXOL 300 MG/ML  SOLN
50.0000 mL | Freq: Once | INTRAMUSCULAR | Status: AC | PRN
  Administered 2013-02-06: 50 mL via ORAL

## 2013-02-06 MED ORDER — IOHEXOL 300 MG/ML  SOLN
100.0000 mL | Freq: Once | INTRAMUSCULAR | Status: AC | PRN
  Administered 2013-02-06: 100 mL via INTRAVENOUS

## 2013-02-06 NOTE — ED Notes (Signed)
Pt reports acute onset of rt-sided abd pain approx 1hr ago, pt admits to hx of kidney stones - states this pain does not feel the same. Pt states abd pain comes in waves, nausea - pt grimacing and guarding on arrival. Pt denies diarrhea, dysuria, hematuria, or any hx of abd surgeries.

## 2013-02-06 NOTE — ED Provider Notes (Signed)
CSN: [id number]     Arrival date & time 02/06/13  0031 History     First MD Initiated Contact with Patient 02/06/13 [phone]     Chief Complaint  Patient presents with  . Abdominal Pain  . Nausea   (Consider location/radiation/quality/duration/timing/severity/associated sxs/prior Treatment) HPI Comments: [name] is a 37 year old morbidly obese, female, with acute onset of upper abdominal pain, nausea, vomiting, she says she hasn't had much appetite.  Today, do, to a migraine headache, that she's been nursing.  All day, but she did eat a salad.  She, states she took 2 Percocet.  This, afternoon for her headache.  She had a normal bowel movement just prior to arrival.  Other than a C-section denies any abdominal surgery  Patient is a 37 y.o. female presenting with abdominal pain. The history is provided by the patient.  Abdominal Pain Pain location:  Epigastric, LUQ and RUQ Pain quality: pressure and stabbing   Pain radiates to:  Does not radiate Pain severity:  Severe Onset quality:  Sudden Duration:  2 hours Timing:  Constant Progression:  Worsening Chronicity:  New Context: not diet changes, not eating, not laxative use, not previous surgeries, not recent travel and not retching   Relieved by:  Nothing Ineffective treatments:  None tried Associated symptoms: nausea   Associated symptoms: no belching, no chills, no constipation, no diarrhea, no dysuria, no fever, no flatus, no hematuria, no shortness of breath, no vaginal bleeding and no vomiting   Risk factors: obesity     Past Medical History  Diagnosis Date  . GERD (gastroesophageal reflux disease)   . Anxiety   . Chronic headaches   . Thyroid disease   . History of kidney stones   . PCOS (polycystic ovarian syndrome)     Takes metformin    Past Surgical History  Procedure Laterality Date  . Cesarean section     Family History  Problem Relation Age of Onset  . Colon cancer Neg Hx   . Crohn's disease Brother     . Irritable bowel syndrome Mother    History  Substance Use Topics  . Smoking status: Never Smoker   . Smokeless tobacco: Never Used  . Alcohol Use: No   OB History   Grav Para Term Preterm Abortions TAB SAB Ect Mult Living                 Review of Systems  Constitutional: Negative for fever and chills.  Respiratory: Negative for shortness of breath.   Gastrointestinal: Positive for nausea and abdominal pain. Negative for vomiting, diarrhea, constipation, blood in stool and flatus.  Genitourinary: Negative for dysuria, hematuria, flank pain and vaginal bleeding.  Neurological: Positive for headaches.  All other systems reviewed and are negative.    Allergies  Betadine and Latex  Home Medications   Current Outpatient Rx  Name  Route  Sig  Dispense  Refill  . ALPRAZolam (XANAX) 0.5 MG tablet   Oral   Take 0.75 mg by mouth at bedtime. Takes one and a half at bedtime         . amLODipine (NORVASC) 5 MG tablet   Oral   Take 5 mg by mouth every evening.         Marland Kitchen HYDROcodone-acetaminophen (NORCO) 10-325 MG per tablet   Oral   Take 1 tablet by mouth every 6 (six) hours as needed for pain.         Marland Kitchen levothyroxine (SYNTHROID, LEVOTHROID) 100 MCG tablet  Oral   Take 100 mcg by mouth daily before breakfast.         . Omega-3 Fatty Acids (FISH OIL) 1000 MG CAPS   Oral   Take 1 capsule by mouth every morning.          Marland Kitchen oxyCODONE-acetaminophen (PERCOCET) 10-325 MG per tablet   Oral   Take 1 tablet by mouth every 6 (six) hours as needed for pain.          . phentermine (ADIPEX-P) 37.5 MG tablet   Oral   Take 37.5 mg by mouth daily before breakfast.         . Prenatal Vit-Fe Fumarate-FA (PRENATAL MULTIVITAMIN) TABS tablet   Oral   Take 1 tablet by mouth every morning.         . promethazine (PHENERGAN) 25 MG tablet   Oral   Take 25 mg by mouth every 8 (eight) hours as needed for nausea.         Marland Kitchen triamterene-hydrochlorothiazide (MAXZIDE-25)  37.5-25 MG per tablet   Oral   Take 1 tablet by mouth every morning.         . predniSONE (DELTASONE) 10 MG tablet   Oral   Take 10 mg by mouth See admin instructions.          BP 154/97  Pulse 86  Temp(Src) 98.5 F (36.9 C) (Oral)  Resp 18  Ht 5\' 4"  (1.626 m)  Wt 270 lb (122.471 kg)  BMI 46.32 kg/m2  SpO2 100%  LMP 02/01/2013 Physical Exam  Nursing note and vitals reviewed. Constitutional: She appears well-developed and well-nourished.  HENT:  Head: Normocephalic.  Eyes: Pupils are equal, round, and reactive to light.  Neck: Normal range of motion.  Cardiovascular: Normal rate and regular rhythm.   Pulmonary/Chest: Effort normal and breath sounds normal.  Abdominal: She exhibits no distension. Bowel sounds are decreased. There is tenderness in the right upper quadrant, epigastric area and left upper quadrant.      ED Course   Procedures (including critical care time)  Labs Reviewed  COMPREHENSIVE METABOLIC PANEL - Abnormal; Notable for the following:    Glucose, Bld 108 (*)    AST 72 (*)    ALT 48 (*)    All other components within normal limits  CBC WITH DIFFERENTIAL  LIPASE, BLOOD  URINALYSIS, ROUTINE W REFLEX MICROSCOPIC  POCT PREGNANCY, URINE   Ct Abdomen Pelvis W Contrast  02/06/2013   *RADIOLOGY REPORT*  Clinical Data: Right-sided abdominal pain with nausea.  Previous history of kidney stones.  Acute onset of pain 1 hour ago.  CT ABDOMEN AND PELVIS WITH CONTRAST  Technique:  Multidetector CT imaging of the abdomen and pelvis was performed following the standard protocol during bolus administration of intravenous contrast.  Contrast: 50mL OMNIPAQUE IOHEXOL 300 MG/ML  SOLN, OMNIPAQUE IOHEXOL 300 MG/ML  SOLN  Comparison: 12/26/2006  Findings: Mild dependent changes in the lung bases.  The liver, spleen, gallbladder, pancreas, adrenal glands, abdominal aorta, inferior vena cava, and retroperitoneal lymph nodes are unremarkable.  There is a punctate sized  stone in the midportion of the left kidney without evidence of obstruction.  No pyelocaliectasis of either kidney.  No solid renal mass lesions. The stomach and small bowel are unremarkable.  Gas and stool in the colon without distension or wall thickening.  Partial fatty atrophy of the left rectus abdominous muscle.  No free air or free fluid in the abdomen.  Pelvis:  Uterus and ovaries are not  enlarged.  Bladder wall is not thickened.  Calcified phleboliths are present.  No free or loculated pelvic fluid collections.  The appendix is not identified.  Decompressed rectosigmoid colon without evidence of inflammatory change.  Normal alignment of the lumbar spine.  No destructive bone lesions appreciated.  IMPRESSION: Punctate sized nonobstructing stone in the left kidney.  No acute process demonstrated in the abdomen or pelvis.   Original Report Authenticated By: Burman Nieves, M.D.   1. Pain, abdominal, nonspecific     MDM  LFTs marginally elevated.  CT scan reveals no anatomic abnormalities although the appendix is not directly identified.  The patient's pain is upper and left side.  She does have a small, punctate stone in the midportion of the left kidney, without any evidence of obstruction.  She will be discharged home to follow up with her primary care physician.  These findings were discussed with the patient and her husband, who voices understanding   Arman Filter, NP 02/06/13 [phone]  Medical screening examination/treatment/procedure(s) were conducted as a shared visit with non-physician practitioner(s) or resident and myself. I personally evaluated the patient during the encounter and agree with the findings and plan unless otherwise indicated.  Recurrent abd pain, acute onset rt flank however entire abdomen hurts. Started shortly after eating. No known GB issues. Pt has had multiple stones, this feels similar. Exam right upper and left upper quad tenderness, no guarding, obese, mild dry mm.  No fevers or chills. No GI bleeding. Intermittent.  Bedside US showed small stones vs sludge, no gb wall thickening or fluid.  Plan for CT stone study, pain meds and fluids.  EMERGENCY DEPARTMENT BILIARY ULTRASOUND INTERPRETATION  "Study: Limited Abdominal Ultrasound of the gallbladder and common bile duct."  INDICATIONS: RUQ pain nausea  Indication: Multiple views of the gallbladder and common bile duct were obtained in real-time with a Multi-frequency probe."  PERFORMED BY: Myself  IMAGES ARCHIVED?: Yes  FINDINGS: Gallstones present, Gallbladder wall normal in thickness and Sonographic Murphy's sign absent  LIMITATIONS: Body Habitus and Bowel Gas  INTERPRETATION: Cholelithiasis vs sludge  CT no acute findings.  Pain improved in ED.  Close fup discussed.   Enid Skeens, MD 02/06/13 [phone]

## 2013-02-09 ENCOUNTER — Telehealth: Payer: Self-pay | Admitting: Gastroenterology

## 2013-02-09 NOTE — Telephone Encounter (Signed)
Pt scheduled to see Mike Gip PA tomorrow at 2:30pm. Pt aware.

## 2013-02-09 NOTE — Telephone Encounter (Signed)
Pt was seen in the ER and bedside ultrasound was done along with ct scan. Per hospital discharge sheet it mentioned that ultrasound showed small gallstones vs sludge. Pt states she is still having pain. Discussed with pt that I could schedule her to see a midlevel but that Dr. Arlyce Dice is not in the office. Let pt know that if she is having a lot of pain again that she could go back to the ER and be seen. Pt states she is going to discuss things with her husband and call back.

## 2013-02-10 ENCOUNTER — Encounter: Payer: Self-pay | Admitting: Physician Assistant

## 2013-02-10 ENCOUNTER — Telehealth: Payer: Self-pay | Admitting: *Deleted

## 2013-02-10 ENCOUNTER — Ambulatory Visit (INDEPENDENT_AMBULATORY_CARE_PROVIDER_SITE_OTHER): Payer: Managed Care, Other (non HMO) | Admitting: Physician Assistant

## 2013-02-10 ENCOUNTER — Other Ambulatory Visit (INDEPENDENT_AMBULATORY_CARE_PROVIDER_SITE_OTHER): Payer: Managed Care, Other (non HMO)

## 2013-02-10 VITALS — BP 132/74 | HR 80 | Ht 62.6 in | Wt 291.5 lb

## 2013-02-10 DIAGNOSIS — R945 Abnormal results of liver function studies: Secondary | ICD-10-CM

## 2013-02-10 DIAGNOSIS — K8050 Calculus of bile duct without cholangitis or cholecystitis without obstruction: Secondary | ICD-10-CM

## 2013-02-10 DIAGNOSIS — R110 Nausea: Secondary | ICD-10-CM

## 2013-02-10 DIAGNOSIS — N200 Calculus of kidney: Secondary | ICD-10-CM | POA: Insufficient documentation

## 2013-02-10 DIAGNOSIS — R1013 Epigastric pain: Secondary | ICD-10-CM

## 2013-02-10 DIAGNOSIS — E039 Hypothyroidism, unspecified: Secondary | ICD-10-CM | POA: Insufficient documentation

## 2013-02-10 DIAGNOSIS — K8020 Calculus of gallbladder without cholecystitis without obstruction: Secondary | ICD-10-CM

## 2013-02-10 DIAGNOSIS — R7989 Other specified abnormal findings of blood chemistry: Secondary | ICD-10-CM

## 2013-02-10 DIAGNOSIS — K8021 Calculus of gallbladder without cholecystitis with obstruction: Secondary | ICD-10-CM

## 2013-02-10 DIAGNOSIS — I10 Essential (primary) hypertension: Secondary | ICD-10-CM

## 2013-02-10 LAB — CBC WITH DIFFERENTIAL/PLATELET
Basophils Relative: 0.4 % (ref 0.0–3.0)
Eosinophils Absolute: 0.1 10*3/uL (ref 0.0–0.7)
Eosinophils Relative: 1 % (ref 0.0–5.0)
Hemoglobin: 12.6 g/dL (ref 12.0–15.0)
Lymphocytes Relative: 33.4 % (ref 12.0–46.0)
MCHC: 33.8 g/dL (ref 30.0–36.0)
Neutro Abs: 6.6 10*3/uL (ref 1.4–7.7)
RBC: 4.04 Mil/uL (ref 3.87–5.11)

## 2013-02-10 MED ORDER — HYDROCODONE-ACETAMINOPHEN 10-325 MG PO TABS: 1.0000 | ORAL_TABLET | Freq: Four times a day (QID) | ORAL | Status: DC | PRN

## 2013-02-10 MED ORDER — OXYCODONE-ACETAMINOPHEN 10-325 MG PO TABS: 1.0000 | ORAL_TABLET | Freq: Four times a day (QID) | ORAL | Status: DC | PRN

## 2013-02-10 NOTE — Progress Notes (Signed)
Subjective:    Patient ID: Vanessa [name], female    DOB: [birth year], 37 y.o.   MRN: [id number]  HPI [name] is a pleasant 37 year old white female known to Dr. Arlyce Dice from prior upper endoscopy done in 2013. This was a normal exam. Patient states she had abrupt onset of upper abdominal pain on Friday, 02/05/2013. She describes the pain as being sharp and stabbing and episodic since. She went to the emergency room on 823 and at that time had CT of the abdomen and pelvis which was negative other than showing a kidney stone in the left kidney. She had bedside ultrasound done through the emergency room but apparently did show gallstones, however there is no mention of the common bile duct on that report. Liver tests showed a total bilirubin of 0.4 alkaline phosphatase of 87 AST of 72 and ALT of 48, WBC 9.6 hemoglobin 13.6 hematocrit of 40.8 The patient states in retrospect that she has been having milder episodes off and on over the past few months. Since Friday she has had recurrent episodes of pain. She says she had one day with no pain and has had at least 1 "attack" each day since. These episodes are fairly intense or nor associated with nausea but no vomiting. She's not having any radiation of her pain. He says the episodes are lasting 2-3 hours. Has not had any melena or diarrhea. No fever or chills. She has been able to eat a little bit in between these attacks but says she is "afraid to eat". She has an appointment to be seen by surgery tomorrow, Dr. Luisa Hart.. She has been using Phenergan for nausea and has a prescription for Norco to use for pain which she says does help    Review of Systems  Constitutional: Positive for appetite change.  HENT: Negative.   Eyes: Negative.   Respiratory: Negative.   Cardiovascular: Negative.   Gastrointestinal: Positive for nausea, abdominal pain and abdominal distention.  Endocrine: Negative.   Genitourinary: Negative.   Musculoskeletal: Negative.   Skin:  Negative.   Allergic/Immunologic: Negative.   Neurological: Negative.   Hematological: Negative.   Psychiatric/Behavioral: Negative.    Outpatient Prescriptions Prior to Visit  Medication Sig Dispense Refill  . ALPRAZolam (XANAX) 0.5 MG tablet Take 0.75 mg by mouth at bedtime.       Marland Kitchen amLODipine (NORVASC) 5 MG tablet Take 5 mg by mouth every evening.      Marland Kitchen levothyroxine (SYNTHROID, LEVOTHROID) 100 MCG tablet Take 100 mcg by mouth daily before breakfast.      . Omega-3 Fatty Acids (FISH OIL) 1000 MG CAPS Take 1 capsule by mouth every morning.       . phentermine (ADIPEX-P) 37.5 MG tablet Take 37.5 mg by mouth daily before breakfast.      . Prenatal Vit-Fe Fumarate-FA (PRENATAL MULTIVITAMIN) TABS tablet Take 1 tablet by mouth every morning.      . promethazine (PHENERGAN) 25 MG tablet Take 25 mg by mouth every 8 (eight) hours as needed for nausea.      Marland Kitchen triamterene-hydrochlorothiazide (MAXZIDE-25) 37.5-25 MG per tablet Take 1 tablet by mouth every morning.      Marland Kitchen HYDROcodone-acetaminophen (NORCO) 10-325 MG per tablet Take 1 tablet by mouth every 6 (six) hours as needed for pain.      Marland Kitchen oxyCODONE-acetaminophen (PERCOCET) 10-325 MG per tablet Take 1 tablet by mouth every 6 (six) hours as needed for pain.       . predniSONE (DELTASONE) 10 MG tablet Take  10 mg by mouth See admin instructions.       No facility-administered medications prior to visit.   Allergies  Allergen Reactions  . Iodine   . Betadine [Povidone Iodine] Hives, Itching and Rash  . Latex Hives, Itching and Rash   Patient Active Problem List   Diagnosis Date Noted  . Nephrolithiasis 02/10/2013  . Hypothyroidism 02/10/2013  . HTN (hypertension) 02/10/2013  . Odynophagia 10/30/2011  . Obesity 10/30/2011   History  Substance Use Topics  . Smoking status: Never Smoker   . Smokeless tobacco: Never Used  . Alcohol Use: No   family history includes Crohn's disease in her brother; Irritable bowel syndrome in her mother.  There is no history of Colon cancer.     Objective:   Physical Exam well-developed obese white female in no acute distress, uncomfortable appearing and holding her abdomen. Accompanied by her husband blood pressure 133/74 pulse 80 height 5 foot 2 weight 291, BMI 52.3. HEENT; nontraumatic normocephalic EOMI PERRLA sclera anicteric, Neck; supple no JVD, Cardiovascular; regular rate and rhythm with S1-S2 no murmur or gallop, Pulmonary; clear bilaterally, Abdomen ;obese soft bowel sounds are present but hypoactive she is tender in the epigastrium there is no guarding or rebound no palpable mass or hepatosplenomegaly, Rectal; exam not done, Extremities; no clubbing cyanosis or edema skin warm and dry, Psych ;mood and affect appropriate,        Assessment & Plan:  #67 37 year old female with biliary colic, with gallstones noted on bedside ultrasound done through the emergency room 02/06/2013, rule out cholecystitis. She has had mildly elevated transaminases. Will also need to consider choledocholithiasis  #2 nephrolithiasis #3 morbid obesity BMI greater than 50 #4 hypertension #5 hypothyroidism  Plan; we'll repeat CBC with differential, CMET and lipase today  schedule for upper abdominal ultrasound early tomorrow morning prior to surgery appointment She will proceed with surgical valuation tomorrow 828 with Dr. Luisa Hart Decision regarding possible need for ERCP pending above studies She will continue Phenergan as needed for nausea and was given a prescription today for Norco 10/325 one every 6 hours as needed for pain #30 no refills. She is asked to push fluids and eat a very bland low-fat diet

## 2013-02-10 NOTE — Patient Instructions (Addendum)
Please go to the basement level to have your labs drawn.   We did schedule the ultrasound at 7:30 am tomorrow [facility] Radiology dept 1st floor. Go to front door of [employer] , by the water fountain and go to registration. Let them know you are going to radiology.  Push liquids.  Very bland diet for now.  We have given you a signed prescription for the percocet.

## 2013-02-10 NOTE — Telephone Encounter (Signed)
Amy Esterwood PA-C told me to do a refill on the Percocet 10/325 mg.  She signed the prescription. When I gave the signed script to the patient she asked me also for the Norco 10/325 mg.  I asked Amy Esterwood PA-C and she said she cannot have both.  I did the Norco 10/325 mg and Amy signed it.  The patient asked me if she could talk to Amy again.  I told her yes , however, I did repeat Amy said she cannot have both prescriptions.  The patient decided to take the Norco 10/325 mg.

## 2013-02-11 ENCOUNTER — Inpatient Hospital Stay (HOSPITAL_COMMUNITY)
Admission: AD | Admit: 2013-02-11 | Discharge: 2013-02-15 | DRG: 419 | Disposition: A | Discharge status: Home / Self Care | Payer: Managed Care, Other (non HMO) | Source: Ambulatory Visit | Attending: General Surgery | Admitting: General Surgery

## 2013-02-11 ENCOUNTER — Encounter (INDEPENDENT_AMBULATORY_CARE_PROVIDER_SITE_OTHER): Payer: Self-pay | Admitting: Surgery

## 2013-02-11 ENCOUNTER — Ambulatory Visit (HOSPITAL_COMMUNITY)
Admission: RE | Admit: 2013-02-11 | Discharge: 2013-02-11 | Disposition: A | Discharge status: Home / Self Care | Payer: Managed Care, Other (non HMO) | Source: Ambulatory Visit | Attending: Physician Assistant | Admitting: Physician Assistant

## 2013-02-11 ENCOUNTER — Ambulatory Visit (INDEPENDENT_AMBULATORY_CARE_PROVIDER_SITE_OTHER): Payer: Managed Care, Other (non HMO) | Admitting: Surgery

## 2013-02-11 ENCOUNTER — Encounter (HOSPITAL_COMMUNITY): Payer: Self-pay

## 2013-02-11 VITALS — BP 152/90 | HR 84 | Resp 18 | Ht 64.5 in | Wt 289.8 lb

## 2013-02-11 DIAGNOSIS — R945 Abnormal results of liver function studies: Secondary | ICD-10-CM

## 2013-02-11 DIAGNOSIS — K8050 Calculus of bile duct without cholangitis or cholecystitis without obstruction: Secondary | ICD-10-CM

## 2013-02-11 DIAGNOSIS — E039 Hypothyroidism, unspecified: Secondary | ICD-10-CM | POA: Diagnosis present

## 2013-02-11 DIAGNOSIS — Z79899 Other long term (current) drug therapy: Secondary | ICD-10-CM

## 2013-02-11 DIAGNOSIS — K8010 Calculus of gallbladder with chronic cholecystitis without obstruction: Principal | ICD-10-CM | POA: Diagnosis present

## 2013-02-11 DIAGNOSIS — I10 Essential (primary) hypertension: Secondary | ICD-10-CM | POA: Diagnosis present

## 2013-02-11 DIAGNOSIS — R110 Nausea: Secondary | ICD-10-CM

## 2013-02-11 DIAGNOSIS — K838 Other specified diseases of biliary tract: Secondary | ICD-10-CM | POA: Diagnosis present

## 2013-02-11 DIAGNOSIS — N200 Calculus of kidney: Secondary | ICD-10-CM | POA: Diagnosis present

## 2013-02-11 DIAGNOSIS — K8020 Calculus of gallbladder without cholecystitis without obstruction: Secondary | ICD-10-CM

## 2013-02-11 DIAGNOSIS — F411 Generalized anxiety disorder: Secondary | ICD-10-CM | POA: Diagnosis present

## 2013-02-11 DIAGNOSIS — K8000 Calculus of gallbladder with acute cholecystitis without obstruction: Secondary | ICD-10-CM

## 2013-02-11 DIAGNOSIS — R932 Abnormal findings on diagnostic imaging of liver and biliary tract: Secondary | ICD-10-CM

## 2013-02-11 DIAGNOSIS — R1013 Epigastric pain: Secondary | ICD-10-CM

## 2013-02-11 DIAGNOSIS — K219 Gastro-esophageal reflux disease without esophagitis: Secondary | ICD-10-CM | POA: Diagnosis present

## 2013-02-11 LAB — COMPREHENSIVE METABOLIC PANEL
ALT: 80 U/L — ABNORMAL HIGH (ref 0–35)
AST: 25 U/L (ref 0–37)
BUN: 12 mg/dL (ref 6–23)
CO2: 29 mEq/L (ref 19–32)
CO2: 29 mEq/L (ref 19–32)
Calcium: 9.2 mg/dL (ref 8.4–10.5)
Calcium: 9.5 mg/dL (ref 8.4–10.5)
Chloride: 96 mEq/L (ref 96–112)
Creatinine, Ser: 0.7 mg/dL (ref 0.4–1.2)
GFR: 92.84 mL/min (ref >60.00)
Glucose, Bld: 88 mg/dL (ref 70–99)
Sodium: 136 mEq/L (ref 135–145)
Total Protein: 6.8 g/dL (ref 6.0–8.3)

## 2013-02-11 LAB — SURGICAL PCR SCREEN
MRSA, PCR: NEGATIVE
Staphylococcus aureus: NEGATIVE

## 2013-02-11 MED ORDER — PROMETHAZINE HCL 25 MG/ML IJ SOLN: 12.5000 mg | Freq: Four times a day (QID) | INTRAMUSCULAR | Status: DC | PRN

## 2013-02-11 MED ORDER — PROMETHAZINE HCL 25 MG PO TABS: 25.0000 mg | ORAL_TABLET | Freq: Four times a day (QID) | ORAL | Status: DC | PRN

## 2013-02-11 MED ORDER — CIPROFLOXACIN IN D5W 400 MG/200ML IV SOLN
400.0000 mg | Freq: Two times a day (BID) | INTRAVENOUS | Status: DC
  Administered 2013-02-11 (×2): 400 mg via INTRAVENOUS
  Filled 2013-02-11 (×4): qty 200

## 2013-02-11 MED ORDER — ONDANSETRON HCL 4 MG/2ML IJ SOLN: 4.0000 mg | Freq: Four times a day (QID) | INTRAMUSCULAR | Status: DC | PRN

## 2013-02-11 MED ORDER — PROMETHAZINE HCL 25 MG RE SUPP: 25.0000 mg | Freq: Four times a day (QID) | RECTAL | Status: DC | PRN

## 2013-02-11 MED ORDER — ENOXAPARIN SODIUM 40 MG/0.4ML ~~LOC~~ SOLN
40.0000 mg | SUBCUTANEOUS | Status: DC
  Administered 2013-02-11 – 2013-02-14 (×3): 40 mg via SUBCUTANEOUS
  Filled 2013-02-11 (×5): qty 0.4

## 2013-02-11 MED ORDER — MORPHINE SULFATE 2 MG/ML IJ SOLN
2.0000 mg | INTRAMUSCULAR | Status: DC | PRN
  Administered 2013-02-11 – 2013-02-12 (×3): 4 mg via INTRAVENOUS
  Filled 2013-02-11 (×3): qty 2

## 2013-02-11 MED ORDER — HYDROMORPHONE HCL PF 1 MG/ML IJ SOLN
1.0000 mg | INTRAMUSCULAR | Status: DC | PRN
  Administered 2013-02-11: 1 mg via INTRAVENOUS
  Filled 2013-02-11: qty 1

## 2013-02-11 MED ORDER — KCL IN DEXTROSE-NACL 20-5-0.9 MEQ/L-%-% IV SOLN
INTRAVENOUS | Status: DC
  Administered 2013-02-11 – 2013-02-13 (×4): via INTRAVENOUS
  Administered 2013-02-14: 20 mL/h via INTRAVENOUS
  Filled 2013-02-11 (×8): qty 1000

## 2013-02-11 MED ORDER — ALPRAZOLAM 0.25 MG PO TABS
0.7500 mg | ORAL_TABLET | Freq: Once | ORAL | Status: AC
  Administered 2013-02-11: 0.75 mg via ORAL
  Filled 2013-02-11 (×4): qty 1

## 2013-02-11 MED ORDER — MORPHINE SULFATE 2 MG/ML IJ SOLN
1.0000 mg | INTRAMUSCULAR | Status: DC | PRN
  Administered 2013-02-11: 1 mg via INTRAVENOUS
  Filled 2013-02-11: qty 1

## 2013-02-11 NOTE — Progress Notes (Signed)
Lab unable to collect cbc, suggested foot stick, Dr Luisa Hart paged with orders received to d/c cbc.  Sharrell Ku RN

## 2013-02-11 NOTE — Progress Notes (Signed)
ADDENDUM:  I personally reviewed patient's record, examined the patient, and formulated the following assessment and plan:  Pt with RUQ pain and US showing gallstones.  Also c/o severe midepigastric pain.  Will get CBC, CMP and lipase.  I will plan on cholecystectomy tom am as long as labs are appropriate.  We will do an IOC as well.    The anatomy & physiology of hepatobiliary & pancreatic function was discussed.  The pathophysiology of gallbladder dysfunction was discussed.  Natural history risks without surgery was discussed.   I feel the risks of no intervention will lead to serious problems that outweigh the operative risks; therefore, I recommended cholecystectomy to remove the pathology.  I explained laparoscopic techniques with possible need for an open approach.  Probable cholangiogram to evaluate the bilary tract was explained as well.    Risks such as bleeding, infection, abscess, leak, injury to other organs, need for further treatment, heart attack, death, and other risks were discussed.  I noted a good likelihood this will help address the problem.  Possibility that this will not correct all abdominal symptoms was explained.  Goals of post-operative recovery were discussed as well.  We will work to minimize complications.  An educational handout further explaining the pathology and treatment options was given as well.  Questions were answered.  The patient expresses understanding & wishes to proceed with surgery.

## 2013-02-11 NOTE — Patient Instructions (Signed)
Go to  [facility] for admission.

## 2013-02-11 NOTE — Plan of Care (Signed)
Problem: Diagnosis - Type of Surgery Goal: General Surgical Patient Education (See Patient Education module for education specifics) chloelithisis

## 2013-02-11 NOTE — Progress Notes (Signed)
Patient ID: Vanessa [name], female   DOB: 1975-07-06, 38 y.o.   MRN: [id number]  No chief complaint on file.   HPI Vanessa [name] is a 37 y.o. female.  Patient presents at the request of Dr. Arlyce Dice for right upper quadrant pain. She was seen one week ago in the emergency room with sudden onset of right upper quadrant pain after being. She has a history of kidney stones. CT was done which showed left small renal calculi that was nonobstructing. She was sent home. The pain has come and gone multiple times over the last week. His locations right upper quadrant. It is severe in nature. She was seen yesterday in Dr. Marzetta Board office and was sent for an ultrasound this morning at [facility] long which shows a small gallstone and mild common duct dilatation. She a mild elevation of AST and ALT. Now her pain is constant and severe made worse with palpation in the right upper quadrant. HPI  Past Medical History  Diagnosis Date  . GERD (gastroesophageal reflux disease)   . Anxiety   . Chronic headaches   . Thyroid disease   . History of kidney stones   . PCOS (polycystic ovarian syndrome)     Takes metformin   . Gallstones     Past Surgical History  Procedure Laterality Date  . Cesarean section      Family History  Problem Relation Age of Onset  . Colon cancer Neg Hx   . Crohn's disease Brother   . Irritable bowel syndrome Mother     Social History History  Substance Use Topics  . Smoking status: Never Smoker   . Smokeless tobacco: Never Used  . Alcohol Use: No    Allergies  Allergen Reactions  . Iodine   . Betadine [Povidone Iodine] Hives, Itching and Rash  . Latex Hives, Itching and Rash    Current Outpatient Prescriptions  Medication Sig Dispense Refill  . ALPRAZolam (XANAX) 0.5 MG tablet Take 0.75 mg by mouth at bedtime.       Marland Kitchen amLODipine (NORVASC) 5 MG tablet Take 5 mg by mouth every evening.      . escitalopram (LEXAPRO) 10 MG tablet       . fluconazole (DIFLUCAN) 150 MG  tablet       . HYDROcodone-acetaminophen (NORCO) 10-325 MG per tablet Take 1 tablet by mouth every 6 (six) hours as needed for pain.  30 tablet  0  . levothyroxine (SYNTHROID, LEVOTHROID) 100 MCG tablet Take 100 mcg by mouth daily before breakfast.      . meloxicam (MOBIC) 7.5 MG tablet       . methocarbamol (ROBAXIN) 500 MG tablet       . methylPREDNIsolone (MEDROL DOSPACK) 4 MG tablet       . Omega-3 Fatty Acids (FISH OIL) 1000 MG CAPS Take 1 capsule by mouth every morning.       Marland Kitchen oxyCODONE-acetaminophen (PERCOCET) 10-325 MG per tablet       . phentermine (ADIPEX-P) 37.5 MG tablet Take 37.5 mg by mouth daily before breakfast.      . Prenatal Vit-Fe Fumarate-FA (PRENATAL MULTIVITAMIN) TABS tablet Take 1 tablet by mouth every morning.      . promethazine (PHENERGAN) 25 MG tablet Take 25 mg by mouth every 8 (eight) hours as needed for nausea.      Marland Kitchen triamterene-hydrochlorothiazide (MAXZIDE-25) 37.5-25 MG per tablet Take 1 tablet by mouth every morning.       No current facility-administered medications for this visit.  Review of Systems Review of Systems  Constitutional: Negative.   HENT: Negative.   Eyes: Negative.   Respiratory: Negative.   Cardiovascular: Negative.   Gastrointestinal: Positive for nausea and abdominal pain.  Endocrine: Negative.   Genitourinary: Negative.   Musculoskeletal: Negative.   Allergic/Immunologic: Negative.   Neurological: Negative.   Hematological: Negative.   Psychiatric/Behavioral: Negative.     Blood pressure 152/90, pulse 84, resp. rate 18, height 5' 4.5" (1.638 m), weight 289 lb 12.8 oz (131.452 kg), last menstrual period 02/01/2013.  Physical Exam Physical Exam  Constitutional: She is oriented to person, place, and time. She appears well-developed and well-nourished.  HENT:  Head: Normocephalic and atraumatic.  Eyes: EOM are normal. Pupils are equal, round, and reactive to light.  Neck: Normal range of motion. Neck supple.   Cardiovascular: Normal rate and regular rhythm.   Pulmonary/Chest: Effort normal and breath sounds normal.  Abdominal: There is tenderness in the right upper quadrant and epigastric area. There is positive Murphy's sign.  Musculoskeletal: Normal range of motion.  Neurological: She is alert and oriented to person, place, and time.  Skin: Skin is warm and dry.  Psychiatric: She has a normal mood and affect. Her behavior is normal. Judgment and thought content normal.    Data Reviewed Clinical Data: Abdominal pain, elevated liver function tests.  ABDOMINAL ULTRASOUND COMPLETE  Comparison: CT scan of February 06, 2013.  Findings:  Gallbladder: Very small gallstone is noted. No gallbladder wall  thickening or pericholecystic fluid is noted.  Common Bile Duct: Measures 8.5 mm in diameter which is above  normal; distal obstruction cannot be excluded.  Liver: No focal mass lesion identified. Increased echogenicity is  noted consistent with fatty infiltration.  IVC: Appears normal.  Pancreas: No abnormality identified.  Spleen: Within normal limits in size and echotexture.  Right kidney: Measures 10.9 cm in length. Normal in size and  parenchymal echogenicity. No evidence of mass or hydronephrosis.  Left kidney: Measures 11.8 cm in length. Normal in size and  parenchymal echogenicity. No evidence of mass or hydronephrosis.  Abdominal Aorta: No aneurysm identified.  IMPRESSION:  Fatty infiltration of the liver. Small solitary gallstone is noted  without gallbladder wall thickening or pericholecystic fluid. Mild  common bile duct dilatation is noted without definite evidence of  obstructing calculus; given the history of elevated liver function  tests, this remains suspicious for distal common bile duct  obstruction.  Original Report Authenticated By: Lupita Raider., M.D.  CBC    Component Value Date/Time   WBC 9.6 02/06/2013 0112   RBC 4.33 02/06/2013 0112   HGB 13.6 02/06/2013 0112    HCT 40.8 02/06/2013 0112   PLT 197 02/06/2013 0112   MCV 94.2 02/06/2013 0112   MCH 31.4 02/06/2013 0112   MCHC 33.3 02/06/2013 0112   RDW 13.9 02/06/2013 0112   LYMPHSABS 3.6 02/06/2013 0112   MONOABS 0.5 02/06/2013 0112   EOSABS 0.1 02/06/2013 0112   BASOSABS 0.0 02/06/2013 0112   CMP     Component Value Date/Time   NA 135 02/06/2013 0112   K 3.6 02/06/2013 0112   CL 97 02/06/2013 0112   CO2 29 02/06/2013 0112   GLUCOSE 108* 02/06/2013 0112   BUN 16 02/06/2013 0112   CREATININE 0.66 02/06/2013 0112   CALCIUM 9.6 02/06/2013 0112   PROT 7.2 02/06/2013 0112   ALBUMIN 3.6 02/06/2013 0112   AST 72* 02/06/2013 0112   ALT 48* 02/06/2013 0112   ALKPHOS 87 02/06/2013 0112  BILITOT 0.4 02/06/2013 0112   GFRNONAA >90 02/06/2013 0112   GFRAA >90 02/06/2013 0112     Assessment    Acute cholecystitis  Left renal calculus    Plan    Admit to in-service at [facility] under the care of Dr. Maisie Fus. Will need cholecystectomy next 24 hours. Left kidney stone does not appear to be symptomatic but may need followup after cholecystectomy.       Theola Cuellar A. 02/11/2013, 11:44 AM

## 2013-02-11 NOTE — Progress Notes (Signed)
If not ductal dilitation by ultrasound would defer ERCP await results of IOC. If ultrasound is negative for stones proceed with HIDA scan

## 2013-02-12 ENCOUNTER — Encounter (HOSPITAL_COMMUNITY): Admission: AD | Disposition: A | Discharge status: Home / Self Care | Payer: Self-pay | Source: Ambulatory Visit

## 2013-02-12 ENCOUNTER — Encounter (HOSPITAL_COMMUNITY): Payer: Self-pay | Admitting: Registered Nurse

## 2013-02-12 ENCOUNTER — Inpatient Hospital Stay (HOSPITAL_COMMUNITY): Payer: Managed Care, Other (non HMO) | Admitting: Anesthesiology

## 2013-02-12 ENCOUNTER — Encounter (HOSPITAL_COMMUNITY): Payer: Self-pay | Admitting: Anesthesiology

## 2013-02-12 ENCOUNTER — Inpatient Hospital Stay (HOSPITAL_COMMUNITY): Payer: Managed Care, Other (non HMO)

## 2013-02-12 DIAGNOSIS — R932 Abnormal findings on diagnostic imaging of liver and biliary tract: Secondary | ICD-10-CM

## 2013-02-12 DIAGNOSIS — K811 Chronic cholecystitis: Secondary | ICD-10-CM

## 2013-02-12 HISTORY — PX: CHOLECYSTECTOMY: SHX55

## 2013-02-12 SURGERY — ERCP, WITH INTERVENTION IF INDICATED
Anesthesia: Monitor Anesthesia Care

## 2013-02-12 SURGERY — LAPAROSCOPIC CHOLECYSTECTOMY WITH INTRAOPERATIVE CHOLANGIOGRAM
Anesthesia: General | Site: Abdomen | Wound class: Contaminated

## 2013-02-12 MED ORDER — LABETALOL HCL 5 MG/ML IV SOLN
INTRAVENOUS | Status: DC | PRN
  Administered 2013-02-12 (×2): 5 mg via INTRAVENOUS

## 2013-02-12 MED ORDER — ROCURONIUM BROMIDE 100 MG/10ML IV SOLN
INTRAVENOUS | Status: DC | PRN
  Administered 2013-02-12: 35 mg via INTRAVENOUS
  Administered 2013-02-12: 5 mg via INTRAVENOUS

## 2013-02-12 MED ORDER — PROPOFOL INFUSION 10 MG/ML OPTIME
INTRAVENOUS | Status: DC | PRN
  Administered 2013-02-12: 100 ug/kg/min via INTRAVENOUS

## 2013-02-12 MED ORDER — HYDROMORPHONE HCL PF 1 MG/ML IJ SOLN
INTRAMUSCULAR | Status: DC | PRN
  Administered 2013-02-12 (×2): 1 mg via INTRAVENOUS

## 2013-02-12 MED ORDER — HYDROMORPHONE HCL PF 1 MG/ML IJ SOLN
INTRAMUSCULAR | Status: AC
  Filled 2013-02-12: qty 1

## 2013-02-12 MED ORDER — BUPIVACAINE-EPINEPHRINE 0.25% -1:200000 IJ SOLN
INTRAMUSCULAR | Status: DC | PRN
  Administered 2013-02-12: 13 mL

## 2013-02-12 MED ORDER — LIDOCAINE HCL (CARDIAC) 20 MG/ML IV SOLN
INTRAVENOUS | Status: DC | PRN
  Administered 2013-02-12: 75 mg via INTRAVENOUS

## 2013-02-12 MED ORDER — GLUCAGON HCL (RDNA) 1 MG IJ SOLR
INTRAMUSCULAR | Status: DC | PRN
  Administered 2013-02-12: 1 mg via INTRAVENOUS

## 2013-02-12 MED ORDER — GLYCOPYRROLATE 0.2 MG/ML IJ SOLN
INTRAMUSCULAR | Status: DC | PRN
  Administered 2013-02-12: 0.6 mg via INTRAVENOUS

## 2013-02-12 MED ORDER — SUFENTANIL CITRATE 50 MCG/ML IV SOLN
INTRAVENOUS | Status: DC | PRN
  Administered 2013-02-12: 15 ug via INTRAVENOUS
  Administered 2013-02-12: 10 ug via INTRAVENOUS
  Administered 2013-02-12: 15 ug via INTRAVENOUS
  Administered 2013-02-12: 10 ug via INTRAVENOUS
  Administered 2013-02-12: 15 ug via INTRAVENOUS
  Administered 2013-02-12: 10 ug via INTRAVENOUS
  Administered 2013-02-12: 5 ug via INTRAVENOUS
  Administered 2013-02-12: 10 ug via INTRAVENOUS

## 2013-02-12 MED ORDER — DEXTROSE 5 % IV SOLN
2.0000 g | INTRAVENOUS | Status: AC
  Administered 2013-02-12: 2 g via INTRAVENOUS

## 2013-02-12 MED ORDER — BUPIVACAINE-EPINEPHRINE PF 0.25-1:200000 % IJ SOLN
INTRAMUSCULAR | Status: AC
  Filled 2013-02-12: qty 30

## 2013-02-12 MED ORDER — NEOSTIGMINE METHYLSULFATE 1 MG/ML IJ SOLN
INTRAMUSCULAR | Status: DC | PRN
  Administered 2013-02-12: 3 mg via INTRAVENOUS

## 2013-02-12 MED ORDER — AMLODIPINE BESYLATE 5 MG PO TABS
5.0000 mg | ORAL_TABLET | Freq: Every evening | ORAL | Status: DC
  Administered 2013-02-14: 5 mg via ORAL
  Filled 2013-02-12 (×4): qty 1

## 2013-02-12 MED ORDER — DEXAMETHASONE SODIUM PHOSPHATE 10 MG/ML IJ SOLN
INTRAMUSCULAR | Status: DC | PRN
  Administered 2013-02-12: 10 mg via INTRAVENOUS

## 2013-02-12 MED ORDER — LACTATED RINGERS IR SOLN
Status: DC | PRN
  Administered 2013-02-12: 1

## 2013-02-12 MED ORDER — OXYCODONE HCL 5 MG/5ML PO SOLN
5.0000 mg | Freq: Once | ORAL | Status: DC | PRN
  Filled 2013-02-12: qty 5

## 2013-02-12 MED ORDER — 0.9 % SODIUM CHLORIDE (POUR BTL) OPTIME
TOPICAL | Status: DC | PRN
  Administered 2013-02-12: 1000 mL

## 2013-02-12 MED ORDER — HYDROMORPHONE HCL PF 1 MG/ML IJ SOLN
0.2500 mg | INTRAMUSCULAR | Status: DC | PRN
  Administered 2013-02-12 (×2): 0.25 mg via INTRAVENOUS
  Administered 2013-02-12: 0.5 mg via INTRAVENOUS

## 2013-02-12 MED ORDER — KETOROLAC TROMETHAMINE 30 MG/ML IJ SOLN
30.0000 mg | Freq: Four times a day (QID) | INTRAMUSCULAR | Status: DC | PRN
  Administered 2013-02-12 – 2013-02-14 (×5): 30 mg via INTRAVENOUS
  Filled 2013-02-12 (×4): qty 1

## 2013-02-12 MED ORDER — LEVOTHYROXINE SODIUM 100 MCG PO TABS
100.0000 ug | ORAL_TABLET | Freq: Every day | ORAL | Status: DC
  Administered 2013-02-14 – 2013-02-15 (×2): 100 ug via ORAL
  Filled 2013-02-12 (×4): qty 1

## 2013-02-12 MED ORDER — PROMETHAZINE HCL 25 MG/ML IJ SOLN: 6.2500 mg | INTRAMUSCULAR | Status: DC | PRN

## 2013-02-12 MED ORDER — OXYCODONE HCL 5 MG PO TABS: 5.0000 mg | ORAL_TABLET | Freq: Once | ORAL | Status: DC | PRN

## 2013-02-12 MED ORDER — CEFOXITIN SODIUM-DEXTROSE 1-4 GM-% IV SOLR (PREMIX)
INTRAVENOUS | Status: AC
  Filled 2013-02-12: qty 100

## 2013-02-12 MED ORDER — IOHEXOL 300 MG/ML  SOLN
INTRAMUSCULAR | Status: AC
  Filled 2013-02-12: qty 1

## 2013-02-12 MED ORDER — GLUCAGON HCL (RDNA) 1 MG IJ SOLR
INTRAMUSCULAR | Status: AC
  Filled 2013-02-12: qty 1

## 2013-02-12 MED ORDER — LACTATED RINGERS IV SOLN
INTRAVENOUS | Status: DC | PRN
  Administered 2013-02-12 (×2): via INTRAVENOUS

## 2013-02-12 MED ORDER — MEPERIDINE HCL 50 MG/ML IJ SOLN: 6.2500 mg | INTRAMUSCULAR | Status: DC | PRN

## 2013-02-12 MED ORDER — ONDANSETRON HCL 4 MG/2ML IJ SOLN
INTRAMUSCULAR | Status: DC | PRN
  Administered 2013-02-12: 4 mg via INTRAVENOUS

## 2013-02-12 MED ORDER — TRIAMTERENE-HCTZ 37.5-25 MG PO TABS
1.0000 | ORAL_TABLET | Freq: Every morning | ORAL | Status: DC
  Administered 2013-02-14: 1 via ORAL
  Filled 2013-02-12 (×4): qty 1

## 2013-02-12 MED ORDER — HYDROMORPHONE HCL PF 1 MG/ML IJ SOLN
0.5000 mg | INTRAMUSCULAR | Status: DC | PRN
  Administered 2013-02-12 – 2013-02-13 (×9): 1 mg via INTRAVENOUS
  Filled 2013-02-12 (×9): qty 1

## 2013-02-12 MED ORDER — MIDAZOLAM HCL 5 MG/5ML IJ SOLN
INTRAMUSCULAR | Status: DC | PRN
  Administered 2013-02-12: 2 mg via INTRAVENOUS
  Administered 2013-02-12: 1 mg via INTRAVENOUS
  Administered 2013-02-12: 2 mg via INTRAVENOUS

## 2013-02-12 MED ORDER — PROPOFOL 10 MG/ML IV BOLUS
INTRAVENOUS | Status: DC | PRN
  Administered 2013-02-12: 200 mg via INTRAVENOUS

## 2013-02-12 MED ORDER — SODIUM CHLORIDE 0.9 % IJ SOLN
INTRAMUSCULAR | Status: DC | PRN
  Administered 2013-02-12: 10:00:00

## 2013-02-12 MED ORDER — SUCCINYLCHOLINE CHLORIDE 20 MG/ML IJ SOLN
INTRAMUSCULAR | Status: DC | PRN
  Administered 2013-02-12: 100 mg via INTRAVENOUS

## 2013-02-12 SURGICAL SUPPLY — 36 items
APPLIER CLIP 5 13 M/L LIGAMAX5 (MISCELLANEOUS) ×2
CABLE HIGH FREQUENCY MONO STRZ (ELECTRODE) ×2 IMPLANT
CANISTER SUCTION 2500CC (MISCELLANEOUS) ×2 IMPLANT
CHLORAPREP W/TINT 26ML (MISCELLANEOUS) ×4 IMPLANT
CLIP APPLIE 5 13 M/L LIGAMAX5 (MISCELLANEOUS) ×1 IMPLANT
CLOTH BEACON ORANGE TIMEOUT ST (SAFETY) ×2 IMPLANT
COVER MAYO STAND STRL (DRAPES) ×2 IMPLANT
DERMABOND ADVANCED (GAUZE/BANDAGES/DRESSINGS)
DERMABOND ADVANCED .7 DNX12 (GAUZE/BANDAGES/DRESSINGS) IMPLANT
DRAPE C-ARM 42X120 X-RAY (DRAPES) ×2 IMPLANT
DRAPE LAPAROSCOPIC ABDOMINAL (DRAPES) ×2 IMPLANT
DRAPE UTILITY XL STRL (DRAPES) ×2 IMPLANT
ELECT REM PT RETURN 9FT ADLT (ELECTROSURGICAL) ×2
ELECTRODE REM PT RTRN 9FT ADLT (ELECTROSURGICAL) ×1 IMPLANT
GOWN BRE IMP PREV XXLGXLNG (GOWN DISPOSABLE) ×2 IMPLANT
GOWN STRL REIN XL XLG (GOWN DISPOSABLE) ×4 IMPLANT
IV CATH 14GX2 1/4 (CATHETERS) ×2 IMPLANT
KIT BASIN OR (CUSTOM PROCEDURE TRAY) ×2 IMPLANT
NEEDLE INSUFFLATION 14GA 120MM (NEEDLE) ×2 IMPLANT
NS IRRIG 1000ML POUR BTL (IV SOLUTION) ×2 IMPLANT
POUCH SPECIMEN RETRIEVAL 10MM (ENDOMECHANICALS) ×2 IMPLANT
SCISSORS ENDO CVD 5DCS (MISCELLANEOUS) ×2 IMPLANT
SET CHOLANGIOGRAPH MIX (MISCELLANEOUS) ×2 IMPLANT
SET IRRIG TUBING LAPAROSCOPIC (IRRIGATION / IRRIGATOR) ×2 IMPLANT
SLEEVE XCEL OPT CAN 5 100 (ENDOMECHANICALS) ×2 IMPLANT
SOLUTION ANTI FOG 6CC (MISCELLANEOUS) ×2 IMPLANT
SUT MNCRL AB 4-0 PS2 18 (SUTURE) ×2 IMPLANT
TOWEL OR 17X26 10 PK STRL BLUE (TOWEL DISPOSABLE) ×2 IMPLANT
TOWEL OR NON WOVEN STRL DISP B (DISPOSABLE) ×2 IMPLANT
TRAY LAP CHOLE (CUSTOM PROCEDURE TRAY) ×2 IMPLANT
TROCAR BLADELESS OPT 5 100 (ENDOMECHANICALS) ×2 IMPLANT
TROCAR BLADELESS OPT 5 75 (ENDOMECHANICALS) ×2 IMPLANT
TROCAR SLEEVE XCEL 5X75 (ENDOMECHANICALS) ×4 IMPLANT
TROCAR XCEL BLUNT TIP 100MML (ENDOMECHANICALS) IMPLANT
TROCAR XCEL NON-BLD 11X100MML (ENDOMECHANICALS) ×2 IMPLANT
TUBING INSUFFLATION 10FT LAP (TUBING) ×2 IMPLANT

## 2013-02-12 NOTE — Interval H&P Note (Signed)
History and Physical Interval Note:  02/12/2013 7:24 AM  Vanessa [name]  has presented today for surgery, with the diagnosis of cholecystitis, cholelithiasis  The various methods of treatment have been discussed with the patient and family. After consideration of risks, benefits and other options for treatment, the patient has consented to  Procedure(s): LAPAROSCOPIC CHOLECYSTECTOMY WITH INTRAOPERATIVE CHOLANGIOGRAM (N/A) as a surgical intervention .  The patient's history has been reviewed, patient examined, no change in status, stable for surgery.  I have reviewed the patient's chart and labs.  Questions were answered to the patient's satisfaction.     [name], MD  Colorectal and General Surgery [facility]

## 2013-02-12 NOTE — Anesthesia Preprocedure Evaluation (Addendum)
Anesthesia Evaluation  Patient identified by MRN, date of birth, ID band Patient awake    Reviewed: Allergy & Precautions, H&P , NPO status , Patient's Chart, lab work & pertinent test results  Airway Mallampati: II TM Distance: >3 FB Neck ROM: Full    Dental  (+) Dental Advisory Given and Teeth Intact   Pulmonary neg pulmonary ROS,  breath sounds clear to auscultation        Cardiovascular hypertension, Pt. on medications Rhythm:Regular Rate:Normal     Neuro/Psych  Headaches, PSYCHIATRIC DISORDERS Anxiety    GI/Hepatic Neg liver ROS, GERD-  Medicated,  Endo/Other  Hypothyroidism Morbid obesity  Renal/GU Renal disease     Musculoskeletal negative musculoskeletal ROS (+)   Abdominal (+) + obese,   Peds  Hematology negative hematology ROS (+)   Anesthesia Other Findings   Reproductive/Obstetrics negative OB ROS                          Anesthesia Physical Anesthesia Plan  ASA: III  Anesthesia Plan: General   Post-op Pain Management:    Induction: Intravenous  Airway Management Planned: Oral ETT  Additional Equipment:   Intra-op Plan:   Post-operative Plan: Extubation in OR  Informed Consent: I have reviewed the patients History and Physical, chart, labs and discussed the procedure including the risks, benefits and alternatives for the proposed anesthesia with the patient or authorized representative who has indicated his/her understanding and acceptance.   Dental advisory given  Plan Discussed with: CRNA  Anesthesia Plan Comments:         Anesthesia Quick Evaluation

## 2013-02-12 NOTE — H&P (View-Only) (Signed)
Patient ID: Vanessa [name], female   DOB: 05/16/1976, 37 y.o.   MRN: [id number]  No chief complaint on file.   HPI Vanessa [name] is a 37 y.o. female.  Patient presents at the request of Dr. Kaplan for right upper quadrant pain. She was seen one week ago in the emergency room with sudden onset of right upper quadrant pain after being. She has a history of kidney stones. CT was done which showed left small renal calculi that was nonobstructing. She was sent home. The pain has come and gone multiple times over the last week. His locations right upper quadrant. It is severe in nature. She was seen yesterday in Dr. Kaplan's office and was sent for an ultrasound this morning at [place] which shows a small gallstone and mild common duct dilatation. She a mild elevation of AST and ALT. Now her pain is constant and severe made worse with palpation in the right upper quadrant. HPI  Past Medical History  Diagnosis Date  . GERD (gastroesophageal reflux disease)   . Anxiety   . Chronic headaches   . Thyroid disease   . History of kidney stones   . PCOS (polycystic ovarian syndrome)     Takes metformin   . Gallstones     Past Surgical History  Procedure Laterality Date  . Cesarean section      Family History  Problem Relation Age of Onset  . Colon cancer Neg Hx   . Crohn's disease Brother   . Irritable bowel syndrome Mother     Social History History  Substance Use Topics  . Smoking status: Never Smoker   . Smokeless tobacco: Never Used  . Alcohol Use: No    Allergies  Allergen Reactions  . Iodine   . Betadine [Povidone Iodine] Hives, Itching and Rash  . Latex Hives, Itching and Rash    Current Outpatient Prescriptions  Medication Sig Dispense Refill  . ALPRAZolam (XANAX) 0.5 MG tablet Take 0.75 mg by mouth at bedtime.       . amLODipine (NORVASC) 5 MG tablet Take 5 mg by mouth every evening.      . escitalopram (LEXAPRO) 10 MG tablet       . fluconazole (DIFLUCAN) 150 MG  tablet       . HYDROcodone-acetaminophen (NORCO) 10-325 MG per tablet Take 1 tablet by mouth every 6 (six) hours as needed for pain.  30 tablet  0  . levothyroxine (SYNTHROID, LEVOTHROID) 100 MCG tablet Take 100 mcg by mouth daily before breakfast.      . meloxicam (MOBIC) 7.5 MG tablet       . methocarbamol (ROBAXIN) 500 MG tablet       . methylPREDNIsolone (MEDROL DOSPACK) 4 MG tablet       . Omega-3 Fatty Acids (FISH OIL) 1000 MG CAPS Take 1 capsule by mouth every morning.       . oxyCODONE-acetaminophen (PERCOCET) 10-325 MG per tablet       . phentermine (ADIPEX-P) 37.5 MG tablet Take 37.5 mg by mouth daily before breakfast.      . Prenatal Vit-Fe Fumarate-FA (PRENATAL MULTIVITAMIN) TABS tablet Take 1 tablet by mouth every morning.      . promethazine (PHENERGAN) 25 MG tablet Take 25 mg by mouth every 8 (eight) hours as needed for nausea.      . triamterene-hydrochlorothiazide (MAXZIDE-25) 37.5-25 MG per tablet Take 1 tablet by mouth every morning.       No current facility-administered medications for this visit.      Review of Systems Review of Systems  Constitutional: Negative.   HENT: Negative.   Eyes: Negative.   Respiratory: Negative.   Cardiovascular: Negative.   Gastrointestinal: Positive for nausea and abdominal pain.  Endocrine: Negative.   Genitourinary: Negative.   Musculoskeletal: Negative.   Allergic/Immunologic: Negative.   Neurological: Negative.   Hematological: Negative.   Psychiatric/Behavioral: Negative.     Blood pressure 152/90, pulse 84, resp. rate 18, height 5' 4.5" (1.638 m), weight 289 lb 12.8 oz (131.452 kg), last menstrual period 02/01/2013.  Physical Exam Physical Exam  Constitutional: She is oriented to person, place, and time. She appears well-developed and well-nourished.  HENT:  Head: Normocephalic and atraumatic.  Eyes: EOM are normal. Pupils are equal, round, and reactive to light.  Neck: Normal range of motion. Neck supple.   Cardiovascular: Normal rate and regular rhythm.   Pulmonary/Chest: Effort normal and breath sounds normal.  Abdominal: There is tenderness in the right upper quadrant and epigastric area. There is positive Murphy's sign.  Musculoskeletal: Normal range of motion.  Neurological: She is alert and oriented to person, place, and time.  Skin: Skin is warm and dry.  Psychiatric: She has a normal mood and affect. Her behavior is normal. Judgment and thought content normal.    Data Reviewed Clinical Data: Abdominal pain, elevated liver function tests.  ABDOMINAL ULTRASOUND COMPLETE  Comparison: CT scan of February 06, 2013.  Findings:  Gallbladder: Very small gallstone is noted. No gallbladder wall  thickening or pericholecystic fluid is noted.  Common Bile Duct: Measures 8.5 mm in diameter which is above  normal; distal obstruction cannot be excluded.  Liver: No focal mass lesion identified. Increased echogenicity is  noted consistent with fatty infiltration.  IVC: Appears normal.  Pancreas: No abnormality identified.  Spleen: Within normal limits in size and echotexture.  Right kidney: Measures 10.9 cm in length. Normal in size and  parenchymal echogenicity. No evidence of mass or hydronephrosis.  Left kidney: Measures 11.8 cm in length. Normal in size and  parenchymal echogenicity. No evidence of mass or hydronephrosis.  Abdominal Aorta: No aneurysm identified.  IMPRESSION:  Fatty infiltration of the liver. Small solitary gallstone is noted  without gallbladder wall thickening or pericholecystic fluid. Mild  common bile duct dilatation is noted without definite evidence of  obstructing calculus; given the history of elevated liver function  tests, this remains suspicious for distal common bile duct  obstruction.  Original Report Authenticated By: James Green Jr., M.D.  CBC    Component Value Date/Time   WBC 9.6 02/06/2013 0112   RBC 4.33 02/06/2013 0112   HGB 13.6 02/06/2013 0112    HCT 40.8 02/06/2013 0112   PLT 197 02/06/2013 0112   MCV 94.2 02/06/2013 0112   MCH 31.4 02/06/2013 0112   MCHC 33.3 02/06/2013 0112   RDW 13.9 02/06/2013 0112   LYMPHSABS 3.6 02/06/2013 0112   MONOABS 0.5 02/06/2013 0112   EOSABS 0.1 02/06/2013 0112   BASOSABS 0.0 02/06/2013 0112   CMP     Component Value Date/Time   NA 135 02/06/2013 0112   K 3.6 02/06/2013 0112   CL 97 02/06/2013 0112   CO2 29 02/06/2013 0112   GLUCOSE 108* 02/06/2013 0112   BUN 16 02/06/2013 0112   CREATININE 0.66 02/06/2013 0112   CALCIUM 9.6 02/06/2013 0112   PROT 7.2 02/06/2013 0112   ALBUMIN 3.6 02/06/2013 0112   AST 72* 02/06/2013 0112   ALT 48* 02/06/2013 0112   ALKPHOS 87 02/06/2013 0112     BILITOT 0.4 02/06/2013 0112   GFRNONAA >90 02/06/2013 0112   GFRAA >90 02/06/2013 0112     Assessment    Acute cholecystitis  Left renal calculus    Plan    Admit to in-service at [facility] under the care of Dr. Giuseppe Duchemin. Will need cholecystectomy next 24 hours. Left kidney stone does not appear to be symptomatic but may need followup after cholecystectomy.       Ameyah Bangura A. 02/11/2013, 11:44 AM    

## 2013-02-12 NOTE — Anesthesia Postprocedure Evaluation (Signed)
Anesthesia Post Note  Patient: Vanessa [name]  Procedure(s) Performed: Procedure(s) (LRB): LAPAROSCOPIC CHOLECYSTECTOMY WITH INTRAOPERATIVE CHOLANGIOGRAM (N/A)  Anesthesia type: General  Patient location: PACU  Post pain: Pain level controlled  Post assessment: Post-op Vital signs reviewed  Last Vitals: BP 129/100  Pulse 75  Temp(Src) 36.6 C (Oral)  Resp 20  Ht 5\' 4"  (1.626 m)  Wt 288 lb 12.8 oz (131 kg)  BMI 49.55 kg/m2  SpO2 96%  LMP 02/01/2013  Post vital signs: Reviewed  Level of consciousness: sedated  Complications: No apparent anesthesia complications

## 2013-02-12 NOTE — Consult Note (Signed)
Referring Provider: Peterson Ao Primary Care Physician:  Jeani Hawking, MD Primary Gastroenterologist:  Dr. Arlyce Dice  Reason for Consultation:  CBD stones  HPI: Vanessa [name] is a 37 y.o. female who was admitted to surgery service yesterday for RUQ and epigastric abdominal pain.  Ultrasound showed gallstones.  She underwent lap chole with IOC today.  IOC revealed retained stones with no flow of contrast into the duodenum.     Past Medical History  Diagnosis Date  . GERD (gastroesophageal reflux disease)   . Anxiety   . Chronic headaches   . Thyroid disease   . History of kidney stones   . PCOS (polycystic ovarian syndrome)     Takes metformin   . Gallstones     Past Surgical History  Procedure Laterality Date  . Cesarean section      Prior to Admission medications   Medication Sig Start Date End Date Taking? Authorizing Provider  ALPRAZolam Prudy Feeler) 0.5 MG tablet Take 0.75 mg by mouth at bedtime.  08/30/11  Yes Historical Provider, MD  amLODipine (NORVASC) 5 MG tablet Take 5 mg by mouth every evening.   Yes Historical Provider, MD  fluconazole (DIFLUCAN) 150 MG tablet Take 150 mg by mouth once.  02/08/13  Yes Historical Provider, MD  HYDROcodone-acetaminophen (NORCO) 10-325 MG per tablet Take 1 tablet by mouth every 6 (six) hours as needed for pain. 02/10/13  Yes Amy S Esterwood, PA-C  levothyroxine (SYNTHROID, LEVOTHROID) 100 MCG tablet Take 100 mcg by mouth daily before breakfast.   Yes Historical Provider, MD  meloxicam (MOBIC) 7.5 MG tablet Take 7.5 mg by mouth daily.  01/18/13  Yes Historical Provider, MD  methocarbamol (ROBAXIN) 500 MG tablet Take 500 mg by mouth 4 (four) times daily as needed (for muscle spasms).  12/22/12  Yes Historical Provider, MD  Omega-3 Fatty Acids (FISH OIL) 1000 MG CAPS Take 1 capsule by mouth every morning.    Yes Historical Provider, MD  phentermine (ADIPEX-P) 37.5 MG tablet Take 37.5 mg by mouth daily before breakfast.   Yes Historical  Provider, MD  Prenatal Vit-Fe Fumarate-FA (PRENATAL MULTIVITAMIN) TABS tablet Take 1 tablet by mouth every morning.   Yes Historical Provider, MD  promethazine (PHENERGAN) 25 MG tablet Take 25 mg by mouth every 8 (eight) hours as needed for nausea.   Yes Historical Provider, MD  triamterene-hydrochlorothiazide (MAXZIDE-25) 37.5-25 MG per tablet Take 1 tablet by mouth every morning.   Yes Historical Provider, MD    Current Facility-Administered Medications  Medication Dose Route Frequency Provider Last Rate Last Dose  . [facility] HOLD] ciprofloxacin (CIPRO) IVPB 400 mg  400 mg Intravenous BID Thomas A. Cornett, MD   400 mg at 02/11/13 2306  . dextrose 5 % and 0.9 % NaCl with KCl 20 mEq/L infusion   Intravenous Continuous Thomas A. Cornett, MD 100 mL/hr at 02/12/13 0408    . [MAR HOLD] enoxaparin (LOVENOX) injection 40 mg  40 mg Subcutaneous Q24H Thomas A. Cornett, MD   40 mg at 02/11/13 1638  . HYDROmorphone (DILAUDID) 1 MG/ML injection           . HYDROmorphone (DILAUDID) injection 0.25-0.5 mg  0.25-0.5 mg Intravenous Q5 min PRN Gaylan Gerold, MD      . meperidine (DEMEROL) injection 6.25-12.5 mg  6.25-12.5 mg Intravenous Q5 min PRN Gaylan Gerold, MD      . Mitzi Hansen HOLD] morphine 2 MG/ML injection 2-4 mg  2-4 mg Intravenous Q2H PRN Thomas A. Cornett, MD  4 mg at 02/12/13 0506  . oxyCODONE (Oxy IR/ROXICODONE) immediate release tablet 5 mg  5 mg Oral Once PRN Gaylan Gerold, MD       Or  . oxyCODONE (ROXICODONE) 5 MG/5ML solution 5 mg  5 mg Oral Once PRN Gaylan Gerold, MD      . Mitzi Hansen HOLD] promethazine (PHENERGAN) tablet 25 mg  25 mg Oral Q6H PRN Doristine Mango, PA-C       Or  . Mitzi Hansen HOLD] promethazine (PHENERGAN) injection 12.5 mg  12.5 mg Intravenous Q6H PRN Doristine Mango, PA-C       Or  . Mitzi Hansen HOLD] promethazine (PHENERGAN) suppository 25 mg  25 mg Rectal Q6H PRN Doristine Mango, PA-C      . promethazine (PHENERGAN) injection 6.25-12.5 mg  6.25-12.5 mg Intravenous Q15 min PRN Gaylan Gerold, MD        Allergies as of 02/11/2013 - Review Complete 02/11/2013  Allergen Reaction Noted  . Iodine  02/10/2013  . Betadine [povidone iodine] Hives, Itching, and Rash 02/06/2013  . Latex Hives, Itching, and Rash 02/06/2013    Family History  Problem Relation Age of Onset  . Colon cancer Neg Hx   . Crohn's disease Brother   . Irritable bowel syndrome Mother     History   Social History  . Marital Status: Married    Spouse Name: N/A    Number of Children: 1  . Years of Education: N/A   Occupational History  . Homemaker    Social History Main Topics  . Smoking status: Never Smoker   . Smokeless tobacco: Never Used  . Alcohol Use: No  . Drug Use: No  . Sexual Activity: No   Other Topics Concern  . Not on file   Social History Narrative   [place]od[place] rare     Review of Systems: Ten point ROS is O/W negative except as mentioned in HPI.  Physical Exam: Vital signs in last 24 hours: Temp:  [97.8 F (36.6 C)-98.1 F (36.7 C)] 97.8 F (36.6 C) (08/29 1015) Pulse Rate:  [62-98] 62 (08/29 1045) Resp:  [18-32] 22 (08/29 1045) BP: (106-171)/(68-95) 171/94 mmHg (08/29 1045) SpO2:  [97 %-100 %] 100 % (08/29 1045) Weight:  [288 lb 12.8 oz (131 kg)-289 lb 12.8 oz (131.452 kg)] 288 lb 12.8 oz (131 kg) (08/28 1427) Last BM Date: 02/10/13 (pta) General:   Alert, Well-developed, well-nourished, pleasant and cooperative in NAD Head:  Normocephalic and atraumatic. Eyes:  Sclera clear, no icterus.  Conjunctiva pink. Ears:  Normal auditory acuity. Mouth:  No deformity or lesions.   Lungs:  Clear throughout to auscultation.  No wheezes, crackles, or rhonchi.  Heart:  Regular rate and rhythm; no murmurs, clicks, rubs,  or gallops. Abdomen:  Soft,nontender, BS active,nonpalp mass or hsm.   Rectal:  Deferred  Msk:  Symmetrical without gross deformities. Pulses:  Normal pulses noted. Extremities:  Without clubbing or edema. Neurologic:  Alert and  oriented x4;  grossly  normal neurologically. Skin:  Intact without significant lesions or rashes. Psych:  Alert and cooperative. Normal mood and affect.  Intake/Output from previous day: 08/28 0701 - 08/29 0700 In: 2218.3 [P.O.:480; I.V.:1338.3; IV Piggyback:400] Out: 750 [Urine:750] Intake/Output this shift: Total I/O In: 1400 [I.V.:1400] Out: 200 [Urine:200]  Lab Results:  Recent Labs  02/10/13 1530  WBC 11.0*  HGB 12.6  HCT 37.4  PLT 277.0   BMET  Recent Labs  02/10/13 1530 02/11/13 1419  NA 132* 136  K  3.5 3.9  CL 96 99  CO2 29 29  GLUCOSE 88 106*  BUN 12 12  CREATININE 0.7 0.65  CALCIUM 9.2 9.5   LFT  Recent Labs  02/11/13 1419  PROT 6.8  ALBUMIN 3.4*  AST 25  ALT 80*  ALKPHOS 113  BILITOT 0.4   Studies/Results: Dg Cholangiogram Operative  02/12/2013   *RADIOLOGY REPORT*  Clinical Data: Intraoperative cholangiogram  INTRAOPERATIVE CHOLANGIOGRAM  Technique:  Multiple fluoroscopic spot radiographs were obtained during intraoperative cholangiogram and are submitted for interpretation post-operatively.  Comparison: Ultrasound, 02/11/2013.  Findings: Two round filling defects are seen in the upper common bile duct, which are most suggestive of air bubbles.  There is no convincing duct stone.  The common bile duct is normal in caliber. There is no intrahepatic bile duct dilation.  IMPRESSION: Intraoperative cholangiogram shows no convincing duct stone or obstruction.   Original Report Authenticated By: Amie Portland, M.D.   US Abdomen Complete  02/11/2013   *RADIOLOGY REPORT*  Clinical Data:  Abdominal pain, elevated liver function tests.  ABDOMINAL ULTRASOUND COMPLETE  Comparison:  CT scan of February 06, 2013.  Findings:  Gallbladder:  Very small gallstone is noted.  No gallbladder wall thickening or pericholecystic fluid is noted.  Common Bile Duct:  Measures 8.5 mm in diameter which is above normal; distal obstruction cannot be excluded.  Liver: No focal mass lesion identified.   Increased echogenicity is noted consistent with fatty infiltration.  IVC:  Appears normal.  Pancreas:  No abnormality identified.  Spleen:  Within normal limits in size and echotexture.  Right kidney:  Measures 10.9 cm in length. Normal in size and parenchymal echogenicity.  No evidence of mass or hydronephrosis.  Left kidney:  Measures 11.8 cm in length. Normal in size and parenchymal echogenicity.  No evidence of mass or hydronephrosis.  Abdominal Aorta:  No aneurysm identified.  IMPRESSION: Fatty infiltration of the liver.  Small solitary gallstone is noted without gallbladder wall thickening or pericholecystic fluid.  Mild common bile duct dilatation is noted without definite evidence of obstructing calculus; given the history of elevated liver function tests, this remains suspicious for distal common bile duct obstruction.   Original Report Authenticated By: Lupita Raider.,  M.D.    IMPRESSION:  -Choledocholithiasis as seen on IOC during cholecystectomy today.    PLAN: -ERCP today.  Antibiotics per surgery.  I spoke with the patient's husband and family regarding the procedure.   ZEHR, JESSICA D.  02/12/2013, 10:54 AM  Pager number [phone]  Chart was reviewed and patient was examined. X-rays and lab were reviewed.    I agree with management and plans.  Intraoperative cholangiogram shows 2 filling defects in the proximal bile duct and no flow into the duodenum, raising the question of retained stones in the duct and possible distal obstruction.  Plan ERCP later today.  The risks, benefits, and possible complications of the procedure, including bleeding, perforation, surgery, and the 5-10% risk for pancreatitis, were explained to the patient.  Patient's questions were answered.   Barbette Hair. Arlyce Dice, M.D., [facility] Cell [phone]

## 2013-02-12 NOTE — Progress Notes (Signed)
Unable to do ERCP today because of scheduling difficulties.  No or time is available.  Plan ERCP in a.m. with Dr. Dorena Cookey

## 2013-02-12 NOTE — Op Note (Signed)
02/11/2013 - 02/12/2013  10:15 AM  PATIENT:  Vanessa [name]  37 y.o. female  Patient Care Team: Jeani Hawking, MD as PCP - General (Obstetrics and Gynecology)  PRE-OPERATIVE DIAGNOSIS:  cholecystitis, cholelithiasis  POST-OPERATIVE DIAGNOSIS:  Cholecystitis, biliary obstruction  PROCEDURE:  LAPAROSCOPIC CHOLECYSTECTOMY WITH INTRAOPERATIVE CHOLANGIOGRAM  SURGEON:  Surgeon(s): Romie Levee, MD  ASSISTANT: none   ANESTHESIA:   general  EBL:     DRAINS: none   SPECIMEN:  Source of Specimen:  Gallbladder  DISPOSITION OF SPECIMEN:  PATHOLOGY  COUNTS:  YES  PLAN OF CARE: Admit to inpatient   PATIENT DISPOSITION:  PACU - hemodynamically stable.  INDICATION: This is a 37 y.o. F with severe RUQ pain and a h/o mildly elevated LFT's.  Her US shows an slightly dilated CBD and gallstones  The anatomy & physiology of hepatobiliary & pancreatic function was discussed.  The pathophysiology of gallbladder dysfunction was discussed.  Natural history risks without surgery was discussed.   I feel the risks of no intervention will lead to serious problems that outweigh the operative risks; therefore, I recommended cholecystectomy to remove the pathology.  I explained laparoscopic techniques with possible need for an open approach.  Probable cholangiogram to evaluate the bilary tract was explained as well.    Risks such as bleeding, infection, abscess, leak, injury to other organs, need for further treatment, heart attack, death, and other risks were discussed.  I noted a good likelihood this will help address the problem.  Possibility that this will not correct all abdominal symptoms was explained.  Goals of post-operative recovery were discussed as well.    OR FINDINGS: Inflamed gallbladder neck, common hepatic duct stones, no flow of contrast into duodenum, glucagon injection attempted as well without relief of obstruction, mildly nodular liver  DESCRIPTION:   The patient was identified  & brought into the operating room. The patient was positioned supine with arms tucked. SCDs were active during the entire case. The patient underwent general anesthesia without any difficulty.  The abdomen was prepped and draped in a sterile fashion. A Surgical Timeout was performed and confirmed our plan.  We positioned the patient in reverse [place] & right side up.  I placed a varies needle into the LUQ.  Once through the muscle layers, I injected saline which flowed without difficulty.  The abdome was then insufflated to .   A 5mm laparoscopic port was placed superior to the umbilicus.  A 5mm camera was inserted.  Entry was clean.  The LUQ site revealed no internal injury either. There were no adhesions to the anterior abdominal wall supraumbilically.  I proceeded to continue with laparoscopic technique. I placed a #5 port in mid subcostal region, another 5mm port in the right flank near the anterior axillary line, and a 11mm port in the left subxiphoid region obliquely within the falciform ligament.  I turned attention to the right upper quadrant.    The gallbladder fundus was elevated cephalad. I used cautery and blunt dissection to free the peritoneal coverings between the gallbladder and the liver on the posteriolateral and anteriomedial walls.   I used careful blunt and cautery dissection with a maryland dissector to help get a good critical view of the cystic artery and cystic duct. I did further dissection to free a few centimeters of the  gallbladder off the liver bed to get a good critical view of the infundibulum and cystic duct. I mobilized the cystic artery.  I skeletonized the cystic duct.  After  getting a good 360 view, I decided to perform a cholangiogram.  I placed a clip on the infundibulum.   I did a partial cystic duct-otomy and ensured patency. I placed a 5 [place] cholangiocatheter through a puncture site at the right subcostal ridge of the abdominal wall and directed it  into the cystic duct.  We ran a cholangiogram with dilute radio-opaque contrast and continuous fluoroscopy.  Contrast flowed from a side branch consistent with cystic duct cannulization. Contrast flowed up the common hepatic duct into the right and left intrahepatic chains out to secondary radicals. Contrast flowed down the common bile duct easily but did not flow across the ampulla into the duodenum.   There were 2 floating radio-opacities in the common hepatic duct. This was consistent with an abnormal cholangiogram.  I had the anesthesiologist give glucogan, and we shot one more run, but the contrast still did not flow through.  I removed the cholangiocatheter and attempted to decompress the duct.  I placed clips on the cystic duct x3.  I completed cystic duct transection.   I placed clips on the cystic artery x3 with 2 proximally.  I ligated the cystic artery using scissors. I freed the gallbladder from its remaining attachments to the liver. I ensured hemostasis on the gallbladder fossa of the liver and elsewhere. I inspected the rest of the abdomen & detected no injury nor bleeding elsewhere.  I irrigated the RUQ with normal saline.  I removed the gallbladder through the hiatal port site.  I closed the skin using 4-0 vicryl stitches.  Sterile dressings were applied. The patient was extubated & arrived in the PACU in stable condition.  I had discussed postoperative care with the patient in the holding area.   I will discuss  operative findings and postoperative goals / instructions with the patient's family.  Instructions are written in the chart as well.

## 2013-02-12 NOTE — Transfer of Care (Signed)
Immediate Anesthesia Transfer of Care Note  Patient: Vanessa [name]  Procedure(s) Performed: Procedure(s): LAPAROSCOPIC CHOLECYSTECTOMY WITH INTRAOPERATIVE CHOLANGIOGRAM (N/A)  Patient Location: PACU  Anesthesia Type:General  Level of Consciousness: awake, alert , oriented and patient cooperative  Airway & Oxygen Therapy: Patient Spontanous Breathing and Patient connected to face mask oxygen  Post-op Assessment: Report given to PACU RN, Post -op Vital signs reviewed and stable and Patient moving all extremities X 4  Post vital signs: stable  Complications: No apparent anesthesia complications

## 2013-02-13 ENCOUNTER — Encounter (HOSPITAL_COMMUNITY): Payer: Self-pay | Admitting: Certified Registered Nurse Anesthetist

## 2013-02-13 ENCOUNTER — Inpatient Hospital Stay (HOSPITAL_COMMUNITY): Payer: Managed Care, Other (non HMO)

## 2013-02-13 ENCOUNTER — Encounter (HOSPITAL_COMMUNITY): Admission: AD | Disposition: A | Discharge status: Home / Self Care | Payer: Self-pay | Source: Ambulatory Visit

## 2013-02-13 ENCOUNTER — Encounter (HOSPITAL_COMMUNITY): Payer: Self-pay | Admitting: Anesthesiology

## 2013-02-13 ENCOUNTER — Inpatient Hospital Stay (HOSPITAL_COMMUNITY): Payer: Managed Care, Other (non HMO) | Admitting: Certified Registered Nurse Anesthetist

## 2013-02-13 HISTORY — PX: ERCP: SHX60

## 2013-02-13 SURGERY — ERCP, WITH INTERVENTION IF INDICATED
Anesthesia: General

## 2013-02-13 MED ORDER — DEXTROSE 5 % IV SOLN
3.0000 g | INTRAVENOUS | Status: DC
  Filled 2013-02-13: qty 3000

## 2013-02-13 MED ORDER — PROPOFOL 10 MG/ML IV BOLUS
INTRAVENOUS | Status: DC | PRN
  Administered 2013-02-13: 200 mg via INTRAVENOUS

## 2013-02-13 MED ORDER — PROMETHAZINE HCL 25 MG/ML IJ SOLN: 6.2500 mg | INTRAMUSCULAR | Status: DC | PRN

## 2013-02-13 MED ORDER — DEXAMETHASONE SODIUM PHOSPHATE 10 MG/ML IJ SOLN
INTRAMUSCULAR | Status: DC | PRN
  Administered 2013-02-13: 10 mg via INTRAVENOUS

## 2013-02-13 MED ORDER — FENTANYL CITRATE 0.05 MG/ML IJ SOLN
25.0000 ug | INTRAMUSCULAR | Status: DC | PRN
  Administered 2013-02-13 (×2): 50 ug via INTRAVENOUS

## 2013-02-13 MED ORDER — DEXTROSE 5 % IV SOLN
3.0000 g | INTRAVENOUS | Status: DC | PRN
  Administered 2013-02-13: 3 g via INTRAVENOUS

## 2013-02-13 MED ORDER — SODIUM CHLORIDE 0.9 % IV SOLN: INTRAVENOUS | Status: DC | PRN

## 2013-02-13 MED ORDER — LACTATED RINGERS IV SOLN
INTRAVENOUS | Status: DC | PRN
  Administered 2013-02-13: 09:00:00 via INTRAVENOUS

## 2013-02-13 MED ORDER — FENTANYL CITRATE 0.05 MG/ML IJ SOLN
INTRAMUSCULAR | Status: AC
  Filled 2013-02-13: qty 2

## 2013-02-13 MED ORDER — OXYCODONE-ACETAMINOPHEN 5-325 MG PO TABS
1.0000 | ORAL_TABLET | ORAL | Status: DC | PRN
Start: 2013-02-13 — End: 2013-02-15
  Administered 2013-02-13 – 2013-02-15 (×6): 2 via ORAL
  Filled 2013-02-13 (×6): qty 2

## 2013-02-13 MED ORDER — ALPRAZOLAM 0.5 MG PO TABS
0.7500 mg | ORAL_TABLET | Freq: Once | ORAL | Status: AC
  Administered 2013-02-13: 0.75 mg via ORAL
  Filled 2013-02-13 (×2): qty 1

## 2013-02-13 MED ORDER — ONDANSETRON HCL 4 MG/2ML IJ SOLN
INTRAMUSCULAR | Status: DC | PRN
  Administered 2013-02-13: 4 mg via INTRAVENOUS

## 2013-02-13 MED ORDER — FENTANYL CITRATE 0.05 MG/ML IJ SOLN
INTRAMUSCULAR | Status: DC | PRN
  Administered 2013-02-13 (×2): 50 ug via INTRAVENOUS

## 2013-02-13 MED ORDER — SODIUM CHLORIDE 0.9 % IV SOLN
INTRAVENOUS | Status: DC | PRN
  Administered 2013-02-13: 11:00:00

## 2013-02-13 MED ORDER — MIDAZOLAM HCL 5 MG/5ML IJ SOLN
INTRAMUSCULAR | Status: DC | PRN
  Administered 2013-02-13 (×2): 2 mg via INTRAVENOUS

## 2013-02-13 MED ORDER — SODIUM CHLORIDE 0.9 % IV SOLN
INTRAVENOUS | Status: DC
  Administered 2013-02-13: 20 mL/h via INTRAVENOUS

## 2013-02-13 MED ORDER — KETOROLAC TROMETHAMINE 30 MG/ML IJ SOLN
INTRAMUSCULAR | Status: AC
  Filled 2013-02-13: qty 1

## 2013-02-13 MED ORDER — LIDOCAINE HCL (CARDIAC) 20 MG/ML IV SOLN
INTRAVENOUS | Status: DC | PRN
  Administered 2013-02-13: 100 mg via INTRAVENOUS

## 2013-02-13 MED ORDER — METOCLOPRAMIDE HCL 5 MG/ML IJ SOLN
INTRAMUSCULAR | Status: DC | PRN
  Administered 2013-02-13: 10 mg via INTRAVENOUS

## 2013-02-13 MED ORDER — SUCCINYLCHOLINE CHLORIDE 20 MG/ML IJ SOLN
INTRAMUSCULAR | Status: DC | PRN
  Administered 2013-02-13: 100 mg via INTRAVENOUS

## 2013-02-13 NOTE — Op Note (Signed)
[facility] [address] [place], [address]   ERCP PROCEDURE REPORT  PATIENT: Vanessa [name], Vanessa [name]  MR# :[id number] BIRTHDATE: 20-Dec-1975  GENDER: Female ENDOSCOPIST: Dorena Cookey, MD REFERRED BY: PROCEDURE DATE:  02/13/2013 PROCEDURE: ASA CLASS: INDICATIONS: suspected common bile duct stone on intraoperative cholangiogram MEDICATIONS:    general anesthesia TOPICAL ANESTHETIC:  DESCRIPTION OF PROCEDURE:   After the risks benefits and alternatives of the procedure were thoroughly explained, informed consent was obtained.  The Pentax side-viewing       endoscope was introduced through the mouth and advanced to the papilla of Vater. It had a normal appearance with some bile visible in the duodenum. Deep selective cannulation of the common bile duct was achieved. A guidewire was advanced into the left hepatic duct. A cholangiogram was obtained which showed a mildly dilated duct. Just above the bifurcation there was a question of 2 filling defects in the left hepatic system versus confluence of ducts in a circular manner mimicking one or more stones. I advanced a guidewire selectively into the left duct and performed a large sphincterotomy. I was able to inflate the balloon above the area of questionable defects and dragged through the left hepatic duct with some resistance. I never saw any stones moved in front of the balloon or into the common bile duct and no stones were delivered to the ampulla on multiple sweeps. After changing the angle of the fluoroscopy camera it appeared even more likely that the apparent filling defects were secondary left hepatic ducts curled in a circular manner and these straightened out when changing angle. After several more balloon sweeps were made I felt that there is no filling defect and the procedure was terminated. The pancreatic duct was not injected.      .       COMPLICATIONS:   none immediate  ENDOSCOPIC IMPRESSION:no  definite common bile duct stone or left hepatic stone although appeared somewhat correlated with intraoperative cholangiogram picture where possible air bubble or stone was suspected  RECOMMENDATIONS:expectant management. Watched overnight for complications and repeat liver function test tomorrow     _______________________________ Rosalie DoctorDorena Cookey, MD 02/13/2013 11:00 AM   CC:

## 2013-02-13 NOTE — Transfer of Care (Signed)
Immediate Anesthesia Transfer of Care Note  Patient: Vanessa [name]  Procedure(s) Performed: Procedure(s) (LRB): ENDOSCOPIC RETROGRADE CHOLANGIOPANCREATOGRAPHY (ERCP) (N/A)  Patient Location: PACU  Anesthesia Type: General  Level of Consciousness: sedated, patient cooperative and responds to stimulaton  Airway & Oxygen Therapy: Patient Spontanous Breathing and Patient connected to face mask oxgen  Post-op Assessment: Report given to PACU RN and Post -op Vital signs reviewed and stable  Post vital signs: Reviewed and stable  Complications: No apparent anesthesia complications

## 2013-02-13 NOTE — Progress Notes (Signed)
ERCP done under general anesthesia. Slightly dilated common bile duct. Initially there appeared to possibly be 2 filling defects in the proximal common bile duct or left hepatic duct. However in the repositioning the fluoroscopy camera these appeared to be small ducts curling in a regular fashion creating the appearance of a stone. I passed a guidewire in the vicinity of the area in question, and inflated the balloon after doing a large sphincterotomy. I was able to drag a balloon in its fully inflated state through these proximal ducts over a guidewire. I never saw any mobile filling defects. Multiple balloon sweeps were made and ultimately I did not think that there were any retained stones in the proximal common bile duct. Multiple spot films were taken. There appeared to be good drainage at the end of the procedure. No pancreatic duct injection was done.  Impression possible proximal common bile duct stones, more likely radiologic artifact, status post sphincterotomy and balloon sweep.   Plan will check liver function test tomorrow.

## 2013-02-13 NOTE — Preoperative (Signed)
Beta Blockers   Reason not to administer Beta Blockers:Not Applicable 

## 2013-02-13 NOTE — Anesthesia Preprocedure Evaluation (Addendum)
Anesthesia Evaluation  Patient identified by MRN, date of birth, ID band Patient awake  General Assessment Comment:.  GERD (gastroesophageal reflux disease)     .  Anxiety     .  Chronic headaches     .  Thyroid disease     .  History of kidney stones     .  PCOS (polycystic ovarian syndrome)         Takes metformin    .  Gallstones     Reviewed: Allergy & Precautions, H&P , NPO status , Patient's Chart, lab work & pertinent test results  Airway Mallampati: II TM Distance: >3 FB Neck ROM: Full    Dental no notable dental hx.    Pulmonary neg pulmonary ROS,  breath sounds clear to auscultation  Pulmonary exam normal       Cardiovascular hypertension, Pt. on medications negative cardio ROS  Rhythm:Regular Rate:Normal     Neuro/Psych  Headaches, Anxiety    GI/Hepatic Neg liver ROS, GERD-  Medicated,  Endo/Other  Hypothyroidism Morbid obesity  Renal/GU Renal disease  negative genitourinary   Musculoskeletal negative musculoskeletal ROS (+)   Abdominal (+) + obese,   Peds negative pediatric ROS (+)  Hematology negative hematology ROS (+)   Anesthesia Other Findings   Reproductive/Obstetrics negative OB ROS Negative pregnancy test.                          Anesthesia Physical Anesthesia Plan  ASA: III  Anesthesia Plan: General   Post-op Pain Management:    Induction: Intravenous  Airway Management Planned: Oral ETT  Additional Equipment:   Intra-op Plan:   Post-operative Plan: Extubation in OR  Informed Consent: I have reviewed the patients History and Physical, chart, labs and discussed the procedure including the risks, benefits and alternatives for the proposed anesthesia with the patient or authorized representative who has indicated his/her understanding and acceptance.   Dental advisory given  Plan Discussed with: CRNA  Anesthesia Plan Comments:          Anesthesia Quick Evaluation

## 2013-02-13 NOTE — Progress Notes (Signed)
Patient requests that nurse call Dr Maisie Fus for her home dose of Xanax.  Dr Maisie Fus made aware order received

## 2013-02-13 NOTE — Anesthesia Postprocedure Evaluation (Signed)
  Anesthesia Post-op Note  Patient: Vanessa [name]  Procedure(s) Performed: Procedure(s) (LRB): ENDOSCOPIC RETROGRADE CHOLANGIOPANCREATOGRAPHY (ERCP) (N/A)  Patient Location: PACU  Anesthesia Type: General  Level of Consciousness: awake and alert   Airway and Oxygen Therapy: Patient Spontanous Breathing  Post-op Pain: mild  Post-op Assessment: Post-op Vital signs reviewed, Patient's Cardiovascular Status Stable, Respiratory Function Stable, Patent Airway and No signs of Nausea or vomiting  Last Vitals:  Filed Vitals:   02/13/13 1145  BP: 145/75  Pulse: 51  Temp:   Resp: 21    Post-op Vital Signs: stable   Complications: No apparent anesthesia complications

## 2013-02-13 NOTE — Progress Notes (Signed)
ERCP in the OR.  Pt. Positioned supine with proper and safe placement of pillows and padding to prevent circulatory impairment.  Staff carefully positioned patient safely with care and caution, gel pads under and between breasts, are boards in place and pillows under feet.  Tolerated procedure.

## 2013-02-13 NOTE — Progress Notes (Signed)
POD 1 Lap chole Subjective: Pt doing about the same, min nausea  Objective: Vital signs in last 24 hours: Temp:  [97.5 F (36.4 C)-99 F (37.2 C)] 98.3 F (36.8 C) (08/30 1130) Pulse Rate:  [47-93] 63 (08/30 1135) Resp:  [16-24] 19 (08/30 1135) BP: (129-163)/(74-100) 139/77 mmHg (08/30 1130) SpO2:  [94 %-100 %] 100 % (08/30 1135)   Intake/Output from previous day: 08/29 0701 - 08/30 0700 In: 2396 [I.V.:2396] Out: 1850 [Urine:1850] Intake/Output this shift: Total I/O In: 550 [I.V.:500; IV Piggyback:50] Out: -    General appearance: alert and cooperative GI: normal findings: soft, non-tender  Incision: healing well  Lab Results:   Recent Labs  02/10/13 1530  WBC 11.0*  HGB 12.6  HCT 37.4  PLT 277.0   BMET  Recent Labs  02/10/13 1530 02/11/13 1419  NA 132* 136  K 3.5 3.9  CL 96 99  CO2 29 29  GLUCOSE 88 106*  BUN 12 12  CREATININE 0.7 0.65  CALCIUM 9.2 9.5   PT/INR No results found for this basename: LABPROT, INR,  in the last 72 hours ABG No results found for this basename: PHART, PCO2, PO2, HCO3,  in the last 72 hours  MEDS, Scheduled . amLODipine  5 mg Oral QPM  .  ceFAZolin (ANCEF) IV  3 g Intravenous On Call  . enoxaparin (LOVENOX) injection  40 mg Subcutaneous Q24H  . fentaNYL      . ketorolac      . levothyroxine  100 mcg Oral QAC breakfast  . triamterene-hydrochlorothiazide  1 tablet Oral q morning - 10a    Studies/Results: Dg Cholangiogram Operative  02/12/2013   *RADIOLOGY REPORT*  Clinical Data: Intraoperative cholangiogram  INTRAOPERATIVE CHOLANGIOGRAM  Technique:  Multiple fluoroscopic spot radiographs were obtained during intraoperative cholangiogram and are submitted for interpretation post-operatively.  Comparison: Ultrasound, 02/11/2013.  Findings: Two round filling defects are seen in the upper common bile duct, which are most suggestive of air bubbles.  There is no convincing duct stone.  The common bile duct is normal in  caliber. There is no intrahepatic bile duct dilation.  IMPRESSION: Intraoperative cholangiogram shows no convincing duct stone or obstruction.   Original Report Authenticated By: Amie Portland, M.D.    Assessment: s/p Procedure(s): ENDOSCOPIC RETROGRADE CHOLANGIOPANCREATOGRAPHY (ERCP) Patient Active Problem List   Diagnosis Date Noted  . Nonspecific (abnormal) findings on radiological and other examination of biliary tract 02/12/2013  . Symptomatic cholelithiasis 02/11/2013  . Nephrolithiasis 02/10/2013  . Hypothyroidism 02/10/2013  . HTN (hypertension) 02/10/2013  . Odynophagia 10/30/2011  . Obesity 10/30/2011    S/p lap chole with abnormal IOC  Plan: ERCP today showed no stone, sphincterotomy complete Will advance diet as tolerated Will check LFT's in AM If LFT's normal tom, will plan on d/c   LOS: 2 days     .[name], MD [facility] Surgery, [place] [phone]   02/13/2013 11:42 AM

## 2013-02-13 NOTE — Transfer of Care (Deleted)
Immediate Anesthesia Transfer of Care Note  Patient: Vanessa [name]  Procedure(s) Performed: Procedure(s) (LRB): ENDOSCOPIC RETROGRADE CHOLANGIOPANCREATOGRAPHY (ERCP) (N/A)  Patient Location: PACU  Anesthesia Type: General  Level of Consciousness: sedated, patient cooperative and responds to stimulaton  Airway & Oxygen Therapy: Patient Spontanous Breathing and Patient connected to face mask oxgen  Post-op Assessment: Report given to PACU RN and Post -op Vital signs reviewed and stable  Post vital signs: Reviewed and stable  Complications: No apparent anesthesia complications 

## 2013-02-14 LAB — COMPREHENSIVE METABOLIC PANEL
ALT: 557 U/L — ABNORMAL HIGH (ref 0–35)
AST: 751 U/L — ABNORMAL HIGH (ref 0–37)
Alkaline Phosphatase: 250 U/L — ABNORMAL HIGH (ref 39–117)
CO2: 25 mEq/L (ref 19–32)
Calcium: 9.3 mg/dL (ref 8.4–10.5)
Chloride: 102 mEq/L (ref 96–112)
GFR calc non Af Amer: >90 mL/min (ref >90)
Potassium: 4.1 mEq/L (ref 3.5–5.1)
Sodium: 137 mEq/L (ref 135–145)
Total Bilirubin: 1.1 mg/dL (ref 0.3–1.2)

## 2013-02-14 MED ORDER — DOCUSATE SODIUM 100 MG PO CAPS
100.0000 mg | ORAL_CAPSULE | Freq: Two times a day (BID) | ORAL | Status: DC
  Administered 2013-02-14 (×2): 100 mg via ORAL
  Filled 2013-02-14 (×4): qty 1

## 2013-02-14 MED ORDER — ALPRAZOLAM 0.5 MG PO TABS
0.7500 mg | ORAL_TABLET | Freq: Every day | ORAL | Status: DC
  Administered 2013-02-14: 0.75 mg via ORAL
  Filled 2013-02-14 (×2): qty 1

## 2013-02-14 MED ORDER — BISACODYL 10 MG RE SUPP: 10.0000 mg | Freq: Every day | RECTAL | Status: DC | PRN

## 2013-02-14 NOTE — Progress Notes (Signed)
POD 1 Lap chole Subjective: Pt feeling somewhat better.  Had an episode of acute pain with walking, min nausea, no flatus or BM  Objective: Vital signs in last 24 hours: Temp:  [97.4 F (36.3 C)-99 F (37.2 C)] 97.9 F (36.6 C) (08/31 0556) Pulse Rate:  [46-93] 76 (08/31 0556) Resp:  [18-24] 20 (08/31 0556) BP: (132-166)/(74-97) 135/87 mmHg (08/31 0556) SpO2:  [94 %-100 %] 95 % (08/31 0556)   Intake/Output from previous day: 08/30 0701 - 08/31 0700 In: 3134.7 [P.O.:240; I.V.:2844.7; IV Piggyback:50] Out: 1300 [Urine:1300] Intake/Output this shift:    General appearance: alert and cooperative GI: normal findings: soft, non-tender  Incision: healing well  Lab Results:  No results found for this basename: WBC, HGB, HCT, PLT,  in the last 72 hours BMET  Recent Labs  02/11/13 1419 02/14/13 0441  NA 136 137  K 3.9 4.1  CL 99 102  CO2 29 25  GLUCOSE 106* 115*  BUN 12 13  CREATININE 0.65 0.58  CALCIUM 9.5 9.3   PT/INR No results found for this basename: LABPROT, INR,  in the last 72 hours ABG No results found for this basename: PHART, PCO2, PO2, HCO3,  in the last 72 hours  MEDS, Scheduled . ALPRAZolam  0.75 mg Oral QHS  . amLODipine  5 mg Oral QPM  . docusate sodium  100 mg Oral BID  . enoxaparin (LOVENOX) injection  40 mg Subcutaneous Q24H  . levothyroxine  100 mcg Oral QAC breakfast  . triamterene-hydrochlorothiazide  1 tablet Oral q morning - 10a    Studies/Results: Dg Cholangiogram Operative  02/12/2013   *RADIOLOGY REPORT*  Clinical Data: Intraoperative cholangiogram  INTRAOPERATIVE CHOLANGIOGRAM  Technique:  Multiple fluoroscopic spot radiographs were obtained during intraoperative cholangiogram and are submitted for interpretation post-operatively.  Comparison: Ultrasound, 02/11/2013.  Findings: Two round filling defects are seen in the upper common bile duct, which are most suggestive of air bubbles.  There is no convincing duct stone.  The common bile  duct is normal in caliber. There is no intrahepatic bile duct dilation.  IMPRESSION: Intraoperative cholangiogram shows no convincing duct stone or obstruction.   Original Report Authenticated By: Amie Portland, M.D.   Dg Ercp Biliary & Pancreatic Ducts  02/13/2013   *RADIOLOGY REPORT*  Clinical Data: Status post cholecystectomy  ERCP  Comparison: None.  Findings: 12 spot films were obtained during ERCP. 6 minutes and 32 seconds of fluoroscopy was utilized.  Filling defects are identified within the left hepatic ductal system.  Balloon sweep of the duct was performed follow-up images demonstrate no definitive filling defect.   Original Report Authenticated By: Alcide Clever, M.D.    Assessment: s/p Procedure(s): ENDOSCOPIC RETROGRADE CHOLANGIOPANCREATOGRAPHY (ERCP) Patient Active Problem List   Diagnosis Date Noted  . Nonspecific (abnormal) findings on radiological and other examination of biliary tract 02/12/2013  . Symptomatic cholelithiasis 02/11/2013  . Nephrolithiasis 02/10/2013  . Hypothyroidism 02/10/2013  . HTN (hypertension) 02/10/2013  . Odynophagia 10/30/2011  . Obesity 10/30/2011    S/p lap chole with abnormal IOC  Plan: ERCP showed no stone, sphincterotomy completed Feeling bloated and c/o LLQ pain, will start colace, suppository PRN Alk Phos and transaminases elevated.  Will check again in AM If LFT's better tom, will plan on d/c   LOS: 3 days     .[name], MD [facility] Surgery, [place] [phone]   02/14/2013 8:10 AM

## 2013-02-14 NOTE — Progress Notes (Signed)
[facility] Progress Note  Subjective: Patient feels okay, no stool or flatus. Abdominal pain improves over the last 24 hours  Objective: Vital signs in last 24 hours: Temp:  [97.4 F (36.3 C)-99 F (37.2 C)] 97.9 F (36.6 C) (08/31 0556) Pulse Rate:  [46-93] 76 (08/31 0556) Resp:  [18-24] 20 (08/31 0556) BP: (132-166)/(74-97) 135/87 mmHg (08/31 0556) SpO2:  [94 %-100 %] 95 % (08/31 0556) Weight change:    PE: Tender diffusely over mid abdomen  Lab Results: Results for orders placed during the hospital encounter of 02/11/13 (from the past 24 hour(s))  COMPREHENSIVE METABOLIC PANEL     Status: Abnormal   Collection Time    02/14/13  4:41 AM      Result Value Range   Sodium 137  135 - 145 mEq/L   Potassium 4.1  3.5 - 5.1 mEq/L   Chloride 102  96 - 112 mEq/L   CO2 25  19 - 32 mEq/L   Glucose, Bld 115 (*) 70 - 99 mg/dL   BUN 13  6 - 23 mg/dL   Creatinine, Ser 7.82  0.50 - 1.10 mg/dL   Calcium 9.3  8.4 - 95.6 mg/dL   Total Protein 6.6  6.0 - 8.3 g/dL   Albumin 3.1 (*) 3.5 - 5.2 g/dL   AST 213 (*) 0 - 37 U/L   ALT 557 (*) 0 - 35 U/L   Alkaline Phosphatase 250 (*) 39 - 117 U/L   Total Bilirubin 1.1  0.3 - 1.2 mg/dL   GFR calc non Af Amer >90  >90 mL/min   GFR calc Af Amer >90  >90 mL/min    Studies/Results: Dg Cholangiogram Operative  02/12/2013   *RADIOLOGY REPORT*  Clinical Data: Intraoperative cholangiogram  INTRAOPERATIVE CHOLANGIOGRAM  Technique:  Multiple fluoroscopic spot radiographs were obtained during intraoperative cholangiogram and are submitted for interpretation post-operatively.  Comparison: Ultrasound, 02/11/2013.  Findings: Two round filling defects are seen in the upper common bile duct, which are most suggestive of air bubbles.  There is no convincing duct stone.  The common bile duct is normal in caliber. There is no intrahepatic bile duct dilation.  IMPRESSION: Intraoperative cholangiogram shows no convincing duct stone or obstruction.   Original  Report Authenticated By: Amie Portland, M.D.   Dg Ercp Biliary & Pancreatic Ducts  02/13/2013   *RADIOLOGY REPORT*  Clinical Data: Status post cholecystectomy  ERCP  Comparison: None.  Findings: 12 spot films were obtained during ERCP. 6 minutes and 32 seconds of fluoroscopy was utilized.  Filling defects are identified within the left hepatic ductal system.  Balloon sweep of the duct was performed follow-up images demonstrate no definitive filling defect.   Original Report Authenticated By: Alcide Clever, M.D.      Assessment: Status post cholecystectomy and ERCP with overall followup that filling defects on intraoperative cholangiogram and ERCP were likely artifactual. Sphincterotomy and multiple balloon sweeps with no stones seen or delivered. LFTs somewhat surprisingly elevated today.  Plan: Agree with keeping in hospital another day. She is tolerating a regular diet. Hopefully her LFTs will prove and she can go home tomorrow.    Covey Baller C 02/14/2013, 8:51 AM

## 2013-02-15 LAB — COMPREHENSIVE METABOLIC PANEL
Albumin: 2.8 g/dL — ABNORMAL LOW (ref 3.5–5.2)
BUN: 11 mg/dL (ref 6–23)
Chloride: 104 mEq/L (ref 96–112)
Creatinine, Ser: 0.69 mg/dL (ref 0.50–1.10)
Total Bilirubin: 0.5 mg/dL (ref 0.3–1.2)

## 2013-02-15 MED ORDER — OXYCODONE-ACETAMINOPHEN 5-325 MG PO TABS: 1.0000 | ORAL_TABLET | ORAL | Status: DC | PRN

## 2013-02-15 MED ORDER — DSS 100 MG PO CAPS: 100.0000 mg | ORAL_CAPSULE | Freq: Two times a day (BID) | ORAL | Status: DC

## 2013-02-15 NOTE — Discharge Summary (Signed)
Physician Discharge Summary  Patient ID: Vanessa [name] MRN: [id number] DOB/AGE: Jan 27, 1976 37 y.o.  Admit date: 02/11/2013 Discharge date: 02/15/2013  Admission Diagnoses: cholecystitis  Discharge Diagnoses:  Active Problems:   Symptomatic cholelithiasis   Nonspecific (abnormal) findings on radiological and other examination of biliary tract   Discharged Condition: good  Hospital Course: The patient was admitted after being seen in the office with severe RUQ pain.  She was taken to the OR the following day for lap cholecystectomy with IOC.    Consults: GI  Significant Diagnostic Studies: labs: cbc, chemistries and endoscopy: ERCP: no CBD stones  Treatments: IV hydration, analgesia: acetaminophen w/ codeine and surgery: lap chole with IOC   Discharge Exam: Blood pressure 144/87, pulse 73, temperature 98.3 F (36.8 C), temperature source Oral, resp. rate 18, height 5\' 4"  (1.626 m), weight 288 lb 12.8 oz (131 kg), last menstrual period 02/01/2013, SpO2 97.00%. General appearance: alert and cooperative GI: soft, non-tender Incision/Wound: Clean, dry, intact  Disposition: 01-Home or Self Care     Medication List    STOP taking these medications       HYDROcodone-acetaminophen 10-325 MG per tablet  Commonly known as:  NORCO      TAKE these medications       ALPRAZolam 0.5 MG tablet  Commonly known as:  XANAX  Take 0.75 mg by mouth at bedtime.     amLODipine 5 MG tablet  Commonly known as:  NORVASC  Take 5 mg by mouth every evening.     DSS 100 MG Caps  Take 100 mg by mouth 2 (two) times daily.     Fish Oil 1000 MG Caps  Take 1 capsule by mouth every morning.     fluconazole 150 MG tablet  Commonly known as:  DIFLUCAN  Take 150 mg by mouth once.     levothyroxine 100 MCG tablet  Commonly known as:  SYNTHROID, LEVOTHROID  Take 100 mcg by mouth daily before breakfast.     meloxicam 7.5 MG tablet  Commonly known as:  MOBIC  Take 7.5 mg by mouth daily.     methocarbamol 500 MG tablet  Commonly known as:  ROBAXIN  Take 500 mg by mouth 4 (four) times daily as needed (for muscle spasms).     oxyCODONE-acetaminophen 5-325 MG per tablet  Commonly known as:  PERCOCET/ROXICET  Take 1-2 tablets by mouth every 4 (four) hours as needed.     phentermine 37.5 MG tablet  Commonly known as:  ADIPEX-P  Take 37.5 mg by mouth daily before breakfast.     prenatal multivitamin Tabs tablet  Take 1 tablet by mouth every morning.     promethazine 25 MG tablet  Commonly known as:  PHENERGAN  Take 25 mg by mouth every 8 (eight) hours as needed for nausea.     triamterene-hydrochlorothiazide 37.5-25 MG per tablet  Commonly known as:  MAXZIDE-25  Take 1 tablet by mouth every morning.           Follow-up Information   Follow up with [name]., MD. Schedule an appointment as soon as possible for a visit in 2 weeks.   Specialty:  General Surgery   Contact information:   [address] [place] [place] [address] [phone]       Signed: [name] 02/15/2013, 8:42 AM

## 2013-02-16 ENCOUNTER — Encounter (HOSPITAL_COMMUNITY): Payer: Self-pay | Admitting: General Surgery

## 2013-02-20 ENCOUNTER — Telehealth (INDEPENDENT_AMBULATORY_CARE_PROVIDER_SITE_OTHER): Payer: Self-pay | Admitting: General Surgery

## 2013-02-20 NOTE — Telephone Encounter (Signed)
She called this evening c/o intermittent episodes of severe, sharp RLQ pain.  The episodes were becoming more frequent.  I advised her to go to the ED for evaluation.

## 2013-02-20 NOTE — Telephone Encounter (Signed)
She called complaining of difficulty with pain control and nausea.  She spoke with Dr. Ezzard Standing last night and he recommended she stay on a liquid diet for the weekend.  She is frustrated that she cannot be more active and that the pain limits her activity.  I told her not to expect so much out of herself after two unplanned procedures.  I advised her to avoid heavy lifting and strenuous activity for 2 weeks from the time of her surgery.  I advised her to take a Phenergan with a Percocet to help with her nausea as well as pain.  I also advised her to take Ibuprofen 600 mg every 6 hours as a way to improve her pain control.

## 2013-02-22 ENCOUNTER — Ambulatory Visit (INDEPENDENT_AMBULATORY_CARE_PROVIDER_SITE_OTHER): Payer: Managed Care, Other (non HMO) | Admitting: Surgery

## 2013-02-22 ENCOUNTER — Telehealth (INDEPENDENT_AMBULATORY_CARE_PROVIDER_SITE_OTHER): Payer: Self-pay | Admitting: *Deleted

## 2013-02-22 ENCOUNTER — Encounter (INDEPENDENT_AMBULATORY_CARE_PROVIDER_SITE_OTHER): Payer: Managed Care, Other (non HMO) | Admitting: Surgery

## 2013-02-22 ENCOUNTER — Encounter (INDEPENDENT_AMBULATORY_CARE_PROVIDER_SITE_OTHER): Payer: Self-pay | Admitting: Surgery

## 2013-02-22 VITALS — BP 130/78 | HR 68 | Temp 97.4°F | Resp 14 | Ht 65.0 in | Wt 288.4 lb

## 2013-02-22 DIAGNOSIS — G8918 Other acute postprocedural pain: Secondary | ICD-10-CM

## 2013-02-22 MED ORDER — OXYCODONE-ACETAMINOPHEN 5-325 MG PO TABS: 1.0000 | ORAL_TABLET | ORAL | Status: DC | PRN

## 2013-02-22 NOTE — Telephone Encounter (Signed)
Patient called back to report that she does still need to be seen.  Explained to patient that Dr. Maisie Fus is unavailable this week but I can schedule her in urgent office this afternoon.  Patient states that Dr. Luisa Hart is actually the surgeon that admitted her to the hospital and she would really rather see him if at all possible.  I spoke to Dr. Luisa Hart who stated he would see the patient.  Appt made for this afternoon with Dr. Luisa Hart and patient updated with appt details at this time.

## 2013-02-22 NOTE — Patient Instructions (Signed)
STAY ON CLEAR LIQUID DIET.  WILL CHECK BLOOD WORK, CT SCAN.  NO EXCESSIVE LIFTING.

## 2013-02-22 NOTE — Telephone Encounter (Signed)
Received a request through mychart from the patient to get a PO appt with Dr. Larwance Rote on 9/8 due to increasing pain.  She states she spoke with the on call MD over the weekend who suggested her be seen on Monday.  Appt scheduled for 9/8 @ 210p.  Called patients home number and left her a message to give [place] a call back so we can update her with this appt.  Also called mobile number however it went straight to voicemail.  Awaiting call back from patient at this time.

## 2013-02-22 NOTE — Telephone Encounter (Signed)
Appt was cancelled with Dr. Luisa Hart since patient is a patient of Dr. Maisie Fus.  Still awaiting patient to call back to see if she needs to be seen in urgent office or an appt made with Dr. Maisie Fus this week.

## 2013-02-22 NOTE — Progress Notes (Signed)
Patient returns 11 days after laparoscopic cholecystectomy by Dr. Maisie Fus. Over the weekend she developed diffuse migratory abdominal pain. She had pain on the right side which migrated toward the center abdomen and now left side. Movement makes it worse. Poor appetite. She is keeping liquids down. Bowel function is normal. No constipation. No diarrhea. Decreased energy. No fever or chills. She spoke with Dr. Ezzard Standing and Dr. Kae Heller or this weekend about her abdominal pain. She was feeling okay last week after surgery and her pain started Friday and Saturday nights. It comes and goes is sharp and stabbing in nature lasting minutes to hours but not completely subsiding.  Exam: Temperature 97 pulse 68 blood pressure 130/78  Female no apparent distress pleasant conversant  Exam abdomen soft incisions clean dry and intact no peritonitis. No rebound or guarding. Diffusely tender though throughout. Obese  Impression: 11 days status post laparoscopic scopic cholecystectomy with abdominal pain  Plan: Check CBC, CMP, lipase and order CT scan of abdomen pelvis to exclude postoperative complication. Further care after this is done. Refill Percocet prescription. Keep on clear liquid diet. She has follow up  next week but can return sooner depending on workup

## 2013-02-23 ENCOUNTER — Ambulatory Visit
Admission: RE | Admit: 2013-02-23 | Discharge: 2013-02-23 | Disposition: A | Discharge status: Home / Self Care | Payer: Managed Care, Other (non HMO) | Source: Ambulatory Visit | Attending: Surgery | Admitting: Surgery

## 2013-02-23 DIAGNOSIS — G8918 Other acute postprocedural pain: Secondary | ICD-10-CM

## 2013-02-23 LAB — CBC WITH DIFFERENTIAL/PLATELET
Eosinophils Relative: 2 % (ref 0–5)
Lymphocytes Relative: 53 % — ABNORMAL HIGH (ref 12–46)
Lymphs Abs: 3.8 10*3/uL (ref 0.7–4.0)
MCV: 90.7 fL (ref 78.0–100.0)
Neutrophils Relative %: 39 % — ABNORMAL LOW (ref 43–77)
Platelets: 369 10*3/uL (ref 150–400)
RBC: 4.09 MIL/uL (ref 3.87–5.11)
WBC: 7.2 10*3/uL (ref 4.0–10.5)

## 2013-02-23 LAB — LIPASE: Lipase: 15 U/L (ref 0–75)

## 2013-02-23 LAB — COMPREHENSIVE METABOLIC PANEL
ALT: 43 U/L — ABNORMAL HIGH (ref 0–35)
Albumin: 4.2 g/dL (ref 3.5–5.2)
CO2: 31 mEq/L (ref 19–32)
Calcium: 9.8 mg/dL (ref 8.4–10.5)
Chloride: 95 mEq/L — ABNORMAL LOW (ref 96–112)
Potassium: 3.9 mEq/L (ref 3.5–5.3)
Sodium: 133 mEq/L — ABNORMAL LOW (ref 135–145)
Total Protein: 7 g/dL (ref 6.0–8.3)

## 2013-02-23 MED ORDER — IOHEXOL 300 MG/ML  SOLN
125.0000 mL | Freq: Once | INTRAMUSCULAR | Status: AC | PRN
  Administered 2013-02-23: 125 mL via INTRAVENOUS

## 2013-02-24 ENCOUNTER — Telehealth (INDEPENDENT_AMBULATORY_CARE_PROVIDER_SITE_OTHER): Payer: Self-pay

## 2013-02-24 NOTE — Telephone Encounter (Signed)
LMOM> CT normal and labs ok. Follow up at scheduled appt.

## 2013-02-24 NOTE — Telephone Encounter (Signed)
Patient called back and I let her know CT results and advised that she needs to keep her follow up appt on Monday.

## 2013-02-24 NOTE — Telephone Encounter (Signed)
Message copied by Brennan Bailey on Wed Feb 24, 2013  9:15 AM ------      Message from: Harriette Bouillon A      Created: Wed Feb 24, 2013  6:53 AM      Regarding: FW:       Looks ok      ----- Message -----         From: Rad Results In Interface         Sent: 02/23/2013   4:42 PM           To: Thomas A. Cornett, MD       ------

## 2013-03-01 ENCOUNTER — Encounter (INDEPENDENT_AMBULATORY_CARE_PROVIDER_SITE_OTHER): Payer: Self-pay | Admitting: General Surgery

## 2013-03-01 ENCOUNTER — Ambulatory Visit (INDEPENDENT_AMBULATORY_CARE_PROVIDER_SITE_OTHER): Payer: Managed Care, Other (non HMO) | Admitting: General Surgery

## 2013-03-01 VITALS — BP 140/90 | HR 82 | Resp 16 | Ht 64.5 in | Wt 288.2 lb

## 2013-03-01 DIAGNOSIS — Z9889 Other specified postprocedural states: Secondary | ICD-10-CM

## 2013-03-01 NOTE — Progress Notes (Signed)
Vanessa [name] is a 37 y.o. female who is status post a lap chole on 8/29.  She had an abnormal cholangiogram and a sphincterotomy and ERCP was performed the following day.  Still having some nausea and pain.  Phenergan working ok.    Objective: Filed Vitals:   03/01/13 1332  BP: 140/90  Pulse: 82  Resp: 16    General appearance: alert and cooperative GI: normal findings: soft, nontender  Incision: healing well   Assessment: s/p  Patient Active Problem List   Diagnosis Date Noted  . Nonspecific (abnormal) findings on radiological and other examination of biliary tract 02/12/2013  . Symptomatic cholelithiasis 02/11/2013  . Nephrolithiasis 02/10/2013  . Hypothyroidism 02/10/2013  . HTN (hypertension) 02/10/2013  . Odynophagia 10/30/2011  . Obesity 10/30/2011    Plan: Pt having post op nausea and pain that is slowly resolving.  A post op CT and lab work have been normal.  I will see her back in 3-4 weeks.    [name], MD [facility] Surgery, [place] [phone]   03/01/2013 1:36 PM

## 2013-03-01 NOTE — Patient Instructions (Addendum)
No heavy lifting for 8 weeks after surgery.  Continue with a low fat diet indefinitely.  I will see you back in 3-4 weeks

## 2013-03-17 ENCOUNTER — Telehealth (INDEPENDENT_AMBULATORY_CARE_PROVIDER_SITE_OTHER): Payer: Self-pay

## 2013-03-17 ENCOUNTER — Emergency Department (HOSPITAL_COMMUNITY)
Admission: EM | Admit: 2013-03-17 | Discharge: 2013-03-18 | Disposition: A | Discharge status: Home / Self Care | Payer: Managed Care, Other (non HMO) | Attending: Emergency Medicine | Admitting: Emergency Medicine

## 2013-03-17 DIAGNOSIS — Z9089 Acquired absence of other organs: Secondary | ICD-10-CM | POA: Insufficient documentation

## 2013-03-17 DIAGNOSIS — R197 Diarrhea, unspecified: Secondary | ICD-10-CM | POA: Insufficient documentation

## 2013-03-17 DIAGNOSIS — Z79899 Other long term (current) drug therapy: Secondary | ICD-10-CM | POA: Insufficient documentation

## 2013-03-17 DIAGNOSIS — K219 Gastro-esophageal reflux disease without esophagitis: Secondary | ICD-10-CM | POA: Insufficient documentation

## 2013-03-17 DIAGNOSIS — R109 Unspecified abdominal pain: Secondary | ICD-10-CM | POA: Insufficient documentation

## 2013-03-17 DIAGNOSIS — F411 Generalized anxiety disorder: Secondary | ICD-10-CM | POA: Insufficient documentation

## 2013-03-17 DIAGNOSIS — Z91040 Latex allergy status: Secondary | ICD-10-CM | POA: Insufficient documentation

## 2013-03-17 DIAGNOSIS — G8918 Other acute postprocedural pain: Secondary | ICD-10-CM | POA: Insufficient documentation

## 2013-03-17 DIAGNOSIS — IMO0002 Reserved for concepts with insufficient information to code with codable children: Secondary | ICD-10-CM | POA: Insufficient documentation

## 2013-03-17 DIAGNOSIS — E079 Disorder of thyroid, unspecified: Secondary | ICD-10-CM | POA: Insufficient documentation

## 2013-03-17 DIAGNOSIS — Z87442 Personal history of urinary calculi: Secondary | ICD-10-CM | POA: Insufficient documentation

## 2013-03-17 MED ORDER — HYDROMORPHONE HCL PF 1 MG/ML IJ SOLN
1.0000 mg | Freq: Once | INTRAMUSCULAR | Status: AC
  Administered 2013-03-18: 1 mg via INTRAVENOUS
  Filled 2013-03-17: qty 1

## 2013-03-17 MED ORDER — DICYCLOMINE HCL 20 MG PO TABS
10.0000 mg | ORAL_TABLET | Freq: Once | ORAL | Status: AC
  Administered 2013-03-18: 10 mg via ORAL
  Filled 2013-03-17: qty 1
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
## 2013-03-17 NOTE — ED Provider Notes (Signed)
CSN: [id number]     Arrival date & time 03/17/13  2045 History   First MD Initiated Contact with Patient 03/17/13 2330     Chief Complaint  Patient presents with  . Post-op Problem   (Consider location/radiation/quality/duration/timing/severity/associated sxs/prior Treatment) HPI  Jenel Gierke is a 37 y.o. female s/p lab chole by Dr. Luisa Hart on 8/29 with abnormal cholangiogram and a sphincterotomy and ERCP day 1 post op. Pt states her small child accidentally kneed her in the stomach early this AM. She has been having increasingly severe pain since then, rated at 9/10, exacerbated by movement, not alleviated with, percocet, robaxin or alternating hot and cold compresses. Pt endorses fever which she has had for 2 days (Tmax 102F) associated with myalgia, exacerbation of chronic migraine,  and multiple episodes of loose stool, which resolved to day. Pt has rec'd her flu vaccine. Denies abnormal bowel movements today, N/V, change in urination. She spoke to Dr. Derrell Lolling who advised her to come to the ED as he is worried about a tear in the scar tissue on the abd wall leading to hernia.   Past Medical History  Diagnosis Date  . GERD (gastroesophageal reflux disease)   . Anxiety   . Chronic headaches   . Thyroid disease   . History of kidney stones   . PCOS (polycystic ovarian syndrome)     Takes metformin   . Gallstones    Past Surgical History  Procedure Laterality Date  . Cesarean section    . Cholecystectomy    . Cholecystectomy N/A 02/12/2013    Procedure: LAPAROSCOPIC CHOLECYSTECTOMY WITH INTRAOPERATIVE CHOLANGIOGRAM;  Surgeon: Romie Levee, MD;  Location: [facility];  Service: General;  Laterality: N/A;   Family History  Problem Relation Age of Onset  . Colon cancer Neg Hx   . Crohn's disease Brother   . Irritable bowel syndrome Mother    History  Substance Use Topics  . Smoking status: Never Smoker   . Smokeless tobacco: Never Used  . Alcohol Use: No   OB History   Grav  Para Term Preterm Abortions TAB SAB Ect Mult Living                 Review of Systems 10 systems reviewed and found to be negative, except as noted in the HPI   Allergies  Iodine; Betadine; Ciprofloxacin in d5w; and Latex  Home Medications   Current Outpatient Rx  Name  Route  Sig  Dispense  Refill  . ALPRAZolam (XANAX) 0.5 MG tablet   Oral   Take 0.75 mg by mouth at bedtime.          Marland Kitchen amLODipine (NORVASC) 5 MG tablet   Oral   Take 5 mg by mouth every evening.         Marland Kitchen HYDROcodone-acetaminophen (NORCO) 10-325 MG per tablet               . lansoprazole (PREVACID) 30 MG capsule               . levothyroxine (SYNTHROID, LEVOTHROID) 100 MCG tablet   Oral   Take 100 mcg by mouth daily before breakfast.         . meloxicam (MOBIC) 7.5 MG tablet               . methocarbamol (ROBAXIN) 500 MG tablet   Oral   Take 500 mg by mouth 4 (four) times daily as needed (for muscle spasms).          Marland Kitchen  methylPREDNIsolone (MEDROL DOSPACK) 4 MG tablet               . Omega-3 Fatty Acids (FISH OIL) 1000 MG CAPS   Oral   Take 1 capsule by mouth every morning.          Marland Kitchen oxyCODONE-acetaminophen (PERCOCET) 10-325 MG per tablet               . phentermine (ADIPEX-P) 37.5 MG tablet   Oral   Take 37.5 mg by mouth daily before breakfast.         . Prenatal Vit-Fe Fumarate-FA (PRENATAL MULTIVITAMIN) TABS tablet   Oral   Take 1 tablet by mouth every morning.         . promethazine (PHENERGAN) 25 MG tablet   Oral   Take 25 mg by mouth every 8 (eight) hours as needed for nausea.         Marland Kitchen triamterene-hydrochlorothiazide (MAXZIDE-25) 37.5-25 MG per tablet   Oral   Take 1 tablet by mouth every morning.          BP 147/88  Pulse 85  Temp(Src) 98 F (36.7 C) (Oral)  Resp 20  SpO2 99%  LMP 02/24/2013 Physical Exam  Nursing note and vitals reviewed. Constitutional: She is oriented to person, place, and time. She appears well-developed and  well-nourished. No distress.  Morbidly obese  HENT:  Head: Normocephalic and atraumatic.  Mouth/Throat: Oropharynx is clear and moist.  Eyes: Conjunctivae and EOM are normal. Pupils are equal, round, and reactive to light.  Neck: Normal range of motion.  Cardiovascular: Normal rate, regular rhythm and intact distal pulses.   Pulmonary/Chest: Effort normal and breath sounds normal. No stridor. No respiratory distress. She has no wheezes. She has no rales. She exhibits no tenderness.  Abdominal: Soft. Bowel sounds are normal. She exhibits no distension and no mass. There is tenderness. There is no rebound and no guarding.  Well healing trochar scars, tender to light palpation of the bilateral upper quadrants with no guarding or rebound.   Musculoskeletal: Normal range of motion.  Neurological: She is alert and oriented to person, place, and time.  Psychiatric: She has a normal mood and affect.    ED Course  Procedures (including critical care time) Labs Review Labs Reviewed  URINALYSIS, ROUTINE W REFLEX MICROSCOPIC  CBC WITH DIFFERENTIAL  COMPREHENSIVE METABOLIC PANEL  LIPASE, BLOOD   Imaging Review No results found.  MDM  No diagnosis found.  Filed Vitals:   03/17/13 2048  BP: 147/88  Pulse: 85  Temp: 98 F (36.7 C)  TempSrc: Oral  Resp: 20  SpO2: 99%     Jaclin Finks is a 37 y.o. female 4weeks post op from lap chole and sphincterotomy, had mild trauma early this AM. SEnt by [facility] for eval. Likely multifactorial with discomfort from viral syndrome causing diarrhea and myalgia. Abd exam tender but non-surgical. Plan is pain control, IVF, Blood work and CT.   Case Signed out to PA Humes at shift change: Plan is to f/u CT and blood-work; consult [facility] if abnormality and otherwise d/c to home with pain meds and close follow up with surgery.    Medications  HYDROmorphone (DILAUDID) injection 1 mg (1 mg Intravenous Given 03/18/13 0027)  dicyclomine (BENTYL) tablet 10 mg (10 mg  Oral Given 03/18/13 0029)    Note: Portions of this report may have been transcribed using voice recognition software. Every effort was made to ensure accuracy; however, inadvertent computerized transcription errors may be  present      Wynetta Emery, PA-C 03/18/13 0106

## 2013-03-18 ENCOUNTER — Encounter (HOSPITAL_COMMUNITY): Payer: Self-pay

## 2013-03-18 ENCOUNTER — Emergency Department (HOSPITAL_COMMUNITY): Payer: Managed Care, Other (non HMO)

## 2013-03-18 LAB — COMPREHENSIVE METABOLIC PANEL
AST: 17 U/L (ref 0–37)
Albumin: 3.6 g/dL (ref 3.5–5.2)
BUN: 10 mg/dL (ref 6–23)
Calcium: 9.7 mg/dL (ref 8.4–10.5)
Chloride: 95 mEq/L — ABNORMAL LOW (ref 96–112)
Creatinine, Ser: 0.65 mg/dL (ref 0.50–1.10)
GFR calc non Af Amer: >90 mL/min (ref >90)
Sodium: 137 mEq/L (ref 135–145)

## 2013-03-18 LAB — URINALYSIS, ROUTINE W REFLEX MICROSCOPIC
Bilirubin Urine: NEGATIVE
Ketones, ur: NEGATIVE mg/dL
Nitrite: NEGATIVE
Urobilinogen, UA: 0.2 mg/dL (ref 0.0–1.0)

## 2013-03-18 LAB — CBC WITH DIFFERENTIAL/PLATELET
Basophils Absolute: 0 10*3/uL (ref 0.0–0.1)
Basophils Relative: 0 % (ref 0–1)
Eosinophils Absolute: 0 10*3/uL (ref 0.0–0.7)
Eosinophils Relative: 0 % (ref 0–5)
HCT: 40.1 % (ref 36.0–46.0)
Lymphocytes Relative: 39 % (ref 12–46)
Lymphs Abs: 3.9 10*3/uL (ref 0.7–4.0)
MCH: 30.7 pg (ref 26.0–34.0)
MCHC: 32.9 g/dL (ref 30.0–36.0)
MCV: 93.3 fL (ref 78.0–100.0)
Monocytes Absolute: 0.5 10*3/uL (ref 0.1–1.0)
Neutrophils Relative %: 56 % (ref 43–77)
RDW: 13.1 % (ref 11.5–15.5)

## 2013-03-18 LAB — LIPASE, BLOOD: Lipase: 20 U/L (ref 11–59)

## 2013-03-18 MED ORDER — IOHEXOL 300 MG/ML  SOLN
50.0000 mL | Freq: Once | INTRAMUSCULAR | Status: AC | PRN
  Administered 2013-03-18: 50 mL via ORAL

## 2013-03-18 MED ORDER — HYDROMORPHONE HCL PF 2 MG/ML IJ SOLN
2.0000 mg | INTRAMUSCULAR | Status: DC | PRN
  Administered 2013-03-18 (×2): 2 mg via INTRAVENOUS
  Filled 2013-03-18 (×2): qty 1

## 2013-03-18 MED ORDER — IOHEXOL 300 MG/ML  SOLN
100.0000 mL | Freq: Once | INTRAMUSCULAR | Status: AC | PRN
  Administered 2013-03-18: 100 mL via INTRAVENOUS

## 2013-03-18 NOTE — ED Provider Notes (Signed)
Medical screening examination/treatment/procedure(s) were performed by non-physician practitioner and as supervising physician I was immediately available for consultation/collaboration.  Sunnie Nielsen, MD 03/18/13 2300

## 2013-03-18 NOTE — ED Provider Notes (Signed)
Patient care assumed from [facility], PA-C at shift change with CT imaging pending.  CT imaging without any acute findings. Labs also reassuring and consistent with baseline. I reviewed these findings with the patient at length and she verbalizes understanding.   On my initial encounter with the patient, she is in no visible or audible discomfort and she is ambulatory in the room with ease; movement does not appear to aggravate her abdominal pain symptoms. Patient is speaking in full sentences. She is pleasant and not wincing as though she is in pain. She states that her pain is well-controlled at this time with 2 mg IV Dilaudid. I have discussed with the patient the plan for discharge with general surgery and primary care followup as an outpatient. Patient states that she has 10-325 Percocet at home for pain control, but she is concerned that this will not control her pain well enough. Patient requests Dilaudid pills for additional pain control. I have told the patient that I am not comfortable prescribing this medication to her and that there is no indication for such strong pain medicine at this time, especially in light of unremarkable CT scan. Patient verbalizes understanding. Patient is hemodynamically stable, afebrile, and appropriate for discharge home with followup as indicated above. Return precautions discussed and patient are agreeable to plan with no unaddressed concerns.  Results for orders placed during the hospital encounter of 03/17/13  URINALYSIS, ROUTINE W REFLEX MICROSCOPIC      Result Value Range   Color, Urine YELLOW  YELLOW   APPearance CLEAR  CLEAR   Specific Gravity, Urine 1.010  1.005 - 1.030   pH 7.0  5.0 - 8.0   Glucose, UA NEGATIVE  NEGATIVE mg/dL   Hgb urine dipstick NEGATIVE  NEGATIVE   Bilirubin Urine NEGATIVE  NEGATIVE   Ketones, ur NEGATIVE  NEGATIVE mg/dL   Protein, ur NEGATIVE  NEGATIVE mg/dL   Urobilinogen, UA 0.2  0.0 - 1.0 mg/dL   Nitrite NEGATIVE   NEGATIVE   Leukocytes, UA NEGATIVE  NEGATIVE  CBC WITH DIFFERENTIAL      Result Value Range   WBC 10.0  4.0 - 10.5 K/uL   RBC 4.30  3.87 - 5.11 MIL/uL   Hemoglobin 13.2  12.0 - 15.0 g/dL   HCT 16.1  09.6 - 04.5 %   MCV 93.3  78.0 - 100.0 fL   MCH 30.7  26.0 - 34.0 pg   MCHC 32.9  30.0 - 36.0 g/dL   RDW 40.9  81.1 - 91.4 %   Platelets 268  150 - 400 K/uL   Neutrophils Relative % 56  43 - 77 %   Neutro Abs 5.6  1.7 - 7.7 K/uL   Lymphocytes Relative 39  12 - 46 %   Lymphs Abs 3.9  0.7 - 4.0 K/uL   Monocytes Relative 5  3 - 12 %   Monocytes Absolute 0.5  0.1 - 1.0 K/uL   Eosinophils Relative 0  0 - 5 %   Eosinophils Absolute 0.0  0.0 - 0.7 K/uL   Basophils Relative 0  0 - 1 %   Basophils Absolute 0.0  0.0 - 0.1 K/uL  COMPREHENSIVE METABOLIC PANEL      Result Value Range   Sodium 137  135 - 145 mEq/L   Potassium 3.6  3.5 - 5.1 mEq/L   Chloride 95 (*) 96 - 112 mEq/L   CO2 29  19 - 32 mEq/L   Glucose, Bld 112 (*) 70 - 99  mg/dL   BUN 10  6 - 23 mg/dL   Creatinine, Ser 0.98  0.50 - 1.10 mg/dL   Calcium 9.7  8.4 - 11.9 mg/dL   Total Protein 7.1  6.0 - 8.3 g/dL   Albumin 3.6  3.5 - 5.2 g/dL   AST 17  0 - 37 U/L   ALT 16  0 - 35 U/L   Alkaline Phosphatase 74  39 - 117 U/L   Total Bilirubin 0.2 (*) 0.3 - 1.2 mg/dL   GFR calc non Af Amer >90  >90 mL/min   GFR calc Af Amer >90  >90 mL/min  LIPASE, BLOOD      Result Value Range   Lipase 20  11 - 59 U/L   Ct Abdomen Pelvis W Contrast  03/18/2013   CLINICAL DATA:  Mid abdominal pain.  EXAM: CT ABDOMEN AND PELVIS WITH CONTRAST  TECHNIQUE: Multidetector CT imaging of the abdomen and pelvis was performed using the standard protocol following bolus administration of intravenous contrast.  CONTRAST:  OMNIPAQUE IOHEXOL 300 MG/ML  SOLN  COMPARISON:  02/23/2013  FINDINGS: Lung bases are clear. No effusions. Heart is normal size.  Prior cholecystectomy. Liver, spleen, pancreas, adrenals and kidneys are unremarkable.  Urinary bladder, uterus  and ovaries unremarkable. Bowel grossly unremarkable. No free fluid, free air, or adenopathy. No acute bony abnormality.  IMPRESSION: No acute findings in the abdomen or pelvis.   Electronically Signed   By: Charlett Nose M.D.   On: 03/18/2013 03:14     Antony Madura, PA-C 03/18/13 1729

## 2013-03-18 NOTE — ED Notes (Signed)
Pt c/o headache  Pt also requesting to talk to PA  PA notified  In to see pt

## 2013-03-18 NOTE — ED Provider Notes (Signed)
Medical screening examination/treatment/procedure(s) were performed by non-physician practitioner and as supervising physician I was immediately available for consultation/collaboration.  Sunnie Nielsen, MD 03/18/13 [phone]

## 2013-03-18 NOTE — ED Notes (Signed)
Returned from CT.

## 2013-03-20 ENCOUNTER — Telehealth (INDEPENDENT_AMBULATORY_CARE_PROVIDER_SITE_OTHER): Payer: Self-pay | Admitting: General Surgery

## 2013-03-20 NOTE — Telephone Encounter (Signed)
Pt c/o diarrhea x3 days.  Denies fevers.  Complete work up in ed 2 days ago was neg.  Recommended plenty of fluids and trying some imodium.  I cannot find a post surgical cause for her symptoms

## 2013-03-25 ENCOUNTER — Encounter (INDEPENDENT_AMBULATORY_CARE_PROVIDER_SITE_OTHER): Payer: Managed Care, Other (non HMO) | Admitting: General Surgery

## 2013-04-19 ENCOUNTER — Telehealth: Payer: Self-pay | Admitting: Gastroenterology

## 2013-04-19 NOTE — Telephone Encounter (Signed)
Pt states she was seen at urgent care and was having abdominal pain and diarrhea. States she was told she might have a blockage. Pt requesting to be seen. Pt scheduled to see Mike Gip PA tomorrow at 10:30am. Pt aware of appt.

## 2013-04-20 ENCOUNTER — Encounter: Payer: Self-pay | Admitting: Physician Assistant

## 2013-04-20 ENCOUNTER — Ambulatory Visit (INDEPENDENT_AMBULATORY_CARE_PROVIDER_SITE_OTHER): Payer: Managed Care, Other (non HMO) | Admitting: Physician Assistant

## 2013-04-20 ENCOUNTER — Other Ambulatory Visit (INDEPENDENT_AMBULATORY_CARE_PROVIDER_SITE_OTHER): Payer: Managed Care, Other (non HMO)

## 2013-04-20 VITALS — BP 138/80 | HR 80 | Temp 97.7°F | Ht 64.5 in | Wt 288.8 lb

## 2013-04-20 DIAGNOSIS — R1084 Generalized abdominal pain: Secondary | ICD-10-CM

## 2013-04-20 DIAGNOSIS — R509 Fever, unspecified: Secondary | ICD-10-CM

## 2013-04-20 DIAGNOSIS — R110 Nausea: Secondary | ICD-10-CM

## 2013-04-20 DIAGNOSIS — R197 Diarrhea, unspecified: Secondary | ICD-10-CM

## 2013-04-20 DIAGNOSIS — Z9049 Acquired absence of other specified parts of digestive tract: Secondary | ICD-10-CM

## 2013-04-20 DIAGNOSIS — Z9889 Other specified postprocedural states: Secondary | ICD-10-CM

## 2013-04-20 LAB — CBC WITH DIFFERENTIAL/PLATELET
Basophils Relative: 0.3 % (ref 0.0–3.0)
Eosinophils Absolute: 0.1 10*3/uL (ref 0.0–0.7)
Eosinophils Relative: 2.3 % (ref 0.0–5.0)
HCT: 37.4 % (ref 36.0–46.0)
Lymphocytes Relative: 45.3 % (ref 12.0–46.0)
MCV: 92.3 fl (ref 78.0–100.0)
Monocytes Absolute: 0.3 10*3/uL (ref 0.1–1.0)
Monocytes Relative: 5.5 % (ref 3.0–12.0)
Neutrophils Relative %: 46.6 % (ref 43.0–77.0)
RBC: 4.05 Mil/uL (ref 3.87–5.11)
WBC: 6.3 10*3/uL (ref 4.5–10.5)

## 2013-04-20 LAB — COMPREHENSIVE METABOLIC PANEL
AST: 20 U/L (ref 0–37)
Albumin: 3.7 g/dL (ref 3.5–5.2)
Alkaline Phosphatase: 62 U/L (ref 39–117)
BUN: 11 mg/dL (ref 6–23)
CO2: 28 mEq/L (ref 19–32)
Calcium: 9.2 mg/dL (ref 8.4–10.5)
GFR: 113.77 mL/min (ref >60.00)
Glucose, Bld: 96 mg/dL (ref 70–99)
Potassium: 3.5 mEq/L (ref 3.5–5.1)
Sodium: 138 mEq/L (ref 135–145)
Total Protein: 6.9 g/dL (ref 6.0–8.3)

## 2013-04-20 MED ORDER — PROMETHAZINE HCL 25 MG PO TABS: 25.0000 mg | ORAL_TABLET | Freq: Four times a day (QID) | ORAL | Status: DC | PRN

## 2013-04-20 MED ORDER — VANCOMYCIN HCL 250 MG PO CAPS: 250.0000 mg | ORAL_CAPSULE | Freq: Four times a day (QID) | ORAL | Status: DC

## 2013-04-20 MED ORDER — SACCHAROMYCES BOULARDII 250 MG PO CAPS: 250.0000 mg | ORAL_CAPSULE | Freq: Two times a day (BID) | ORAL | Status: DC

## 2013-04-20 MED ORDER — GLYCOPYRROLATE 2 MG PO TABS: 2.0000 mg | ORAL_TABLET | Freq: Two times a day (BID) | ORAL | Status: DC

## 2013-04-20 NOTE — Patient Instructions (Signed)
Please go to the basement level to have your labs drawn and a stool study. We sent prescriptions to [facility]. 1. Florastor probiotic 2. Phenergan 25 mg. 3. Vancomycin 250 mg 4. Robinul forte 2 mg twice daily for cramping and spasms.  Push fluids and eat a bland diet for now.

## 2013-04-20 NOTE — Progress Notes (Signed)
Subjective:    Patient ID: Vanessa [name], female    DOB: [birth year], 37 y.o.   MRN: [id number]  HPI  Vanessa [name] is a pleasant 37 year old female known to Dr. Arlyce Dice. She was last seen in this office in August of 2014 at that time with acute severe abdominal pain. She was felt to have acute cholecystitis. She was referred to surgery and underwent a cholecystectomy on 02/12/2013 per Dr. Luisa Hart. She was found to have a positive IOC and had ERCP sphincterotomy and stone extraction done per Dr. Madilyn Fireman on 02/13/2013. Patient had done well postoperatively but had a slow recovery. Caps she says she had been doing fairly well up until about one week ago when she developed a general sense of fatigue and vague abdominal pain and then fever to 102 at home. She says this was associated with  Chills. Within the next 24 hours she developed fairly profuse diarrhea and anorexia. She continued to have nausea anorexia fatigue and multiple episodes of loose stools per day through this past weekend. She says she was having at least 1214 bowel movements per day. She was taking Imodium when necessary and states she's been unable to eat much at all. She was seen at a fast bed over the weekend. No labs were done she was given a prescription for Lomotil and asked to take up to 8 per day. She took several doses of Lomotil on Sunday evening and then yesterday morning and says she hasn't had any diarrhea since. However all of her other symptoms persist. She  says she's exhausted, nauseated, and having ongoing abdominal discomfort and cramping. No other family members have been ill, she has not been on any new medications nor any antibiotics since discharge from the hospital in early September.    Review of Systems  Constitutional: Positive for fever, chills, appetite change and fatigue.  HENT: Negative.   Eyes: Negative.   Respiratory: Negative.   Cardiovascular: Negative.   Gastrointestinal: Positive for nausea, abdominal pain and  diarrhea.  Endocrine: Negative.   Genitourinary: Negative.   Musculoskeletal: Negative.   Allergic/Immunologic: Negative.   Neurological: Negative.   Hematological: Negative.   Psychiatric/Behavioral: Negative.    Outpatient Prescriptions Prior to Visit  Medication Sig Dispense Refill  . ALPRAZolam (XANAX) 0.5 MG tablet Take 1 mg by mouth at bedtime.       Marland Kitchen amLODipine (NORVASC) 5 MG tablet Take 5 mg by mouth every evening.      . cholecalciferol (VITAMIN D) 1000 UNITS tablet Take 1,000 Units by mouth daily.      Marland Kitchen HYDROcodone-acetaminophen (NORCO) 10-325 MG per tablet Take 1-2 tablets by mouth every 4 (four) hours as needed (migraines).       . lansoprazole (PREVACID) 30 MG capsule Take 30 mg by mouth daily.       Marland Kitchen levothyroxine (SYNTHROID, LEVOTHROID) 100 MCG tablet Take 100 mcg by mouth daily before breakfast.      . methocarbamol (ROBAXIN) 500 MG tablet Take 500 mg by mouth 4 (four) times daily as needed (for muscle spasms).       . Omega-3 Fatty Acids (FISH OIL) 1000 MG CAPS Take 1 capsule by mouth every morning.       . phentermine (ADIPEX-P) 37.5 MG tablet Take 37.5 mg by mouth daily before breakfast.      . Prenatal Vit-Fe Fumarate-FA (PRENATAL MULTIVITAMIN) TABS tablet Take 1 tablet by mouth every morning.      . SUMAtriptan-naproxen (TREXIMET) 85-500 MG per tablet Take 1 tablet  by mouth every 2 (two) hours as needed for migraine.      . triamterene-hydrochlorothiazide (MAXZIDE-25) 37.5-25 MG per tablet Take 1 tablet by mouth every morning.      . methylPREDNIsolone (MEDROL DOSPACK) 4 MG tablet       . oxyCODONE-acetaminophen (PERCOCET) 10-325 MG per tablet Take 1 tablet by mouth every 6 (six) hours as needed for pain.       . promethazine (PHENERGAN) 25 MG tablet Take 25 mg by mouth every 8 (eight) hours as needed for nausea.       No facility-administered medications prior to visit.   Allergies  Allergen Reactions  . Ciprofloxacin In D5w Rash    Pt states developed rash on  Lt arm when IV Cipro infusing  . Iodine   . Betadine [Povidone Iodine] Hives, Itching and Rash  . Latex Hives, Itching and Rash   Patient Active Problem List   Diagnosis Date Noted  . S/P laparoscopic cholecystectomy 04/20/2013  . Nonspecific (abnormal) findings on radiological and other examination of biliary tract 02/12/2013  . Nephrolithiasis 02/10/2013  . Hypothyroidism 02/10/2013  . HTN (hypertension) 02/10/2013  . Odynophagia 10/30/2011  . Obesity 10/30/2011   History  Substance Use Topics  . Smoking status: Never Smoker   . Smokeless tobacco: Never Used  . Alcohol Use: No       Objective:   Physical Exam   well-developed obese white female in no acute distress, accompanied by her husband. Patient fatigued appearing. Blood pressure 138/80 pulse 84 temp 97 7 height 5 foot 4 weight 288. HEENT; nontraumatic normocephalic EOMI PERRLA sclera anicteric, Supple; no JVD, Cardiovascular ;regular rate and rhythm with S1-S2 no murmur or gallop, Pulm;clear bilaterally, Abdomen; large soft she has mild generalized tenderness nonfocal there's no guarding or rebound bowel sounds are present no palpable mass or hepatosplenomegaly, Rectal;l exam not done, Extremities; no clubbing cyanosis or edema skin warm and dry, Psych; mood and affect normal and appropriate        Assessment & Plan:  #77  37 year old female status post laparoscopic cholecystectomy for acute cholecystitis August 2014. Patient also with choledocholithiasis status post ERCP sphincterotomy and stone extraction August 2014. #2  Acute illness x6 days with fatigue malaise nausea intermittent vomiting generalized abdominal discomfort and diarrhea. Patient had fever on  Onset. Am concerned she may have C. difficile colitis versus other acute infectious gastroenteritis/colitis.  #3 morbid obesity #4 hypothyroidism #5 hypertension  Plan; CBC with differential, CMET,and stool pathogen panel today Start Phenergan 25 mg one half  to one tablet every 6 hours when necessary nausea Robinul Forte 2 mg by mouth twice daily as needed for cramping and spasm Florastor one by mouth twice daily x3 weeks We'll empirically start vancomycin 250 mg by mouth 4 times daily x14 days. Home to rest, soft bland diet with liberal fluids. Also discussed infectious nature of C. difficile and reviewed enteric precautions. We'll plan to followup in the office in 2 weeks or sooner if needed

## 2013-04-20 NOTE — Progress Notes (Signed)
Reviewed and agree with management. Robert D. Kaplan, M.D., FACG  

## 2013-04-21 ENCOUNTER — Other Ambulatory Visit: Payer: Managed Care, Other (non HMO)

## 2013-04-21 DIAGNOSIS — R509 Fever, unspecified: Secondary | ICD-10-CM

## 2013-04-21 DIAGNOSIS — R110 Nausea: Secondary | ICD-10-CM

## 2013-04-21 DIAGNOSIS — R1084 Generalized abdominal pain: Secondary | ICD-10-CM

## 2013-04-21 DIAGNOSIS — R197 Diarrhea, unspecified: Secondary | ICD-10-CM

## 2013-04-22 ENCOUNTER — Other Ambulatory Visit: Payer: Self-pay

## 2013-04-22 LAB — GASTROINTESTINAL PATHOGEN PANEL PCR
C. difficile Tox A/B, PCR: NEGATIVE
Campylobacter, PCR: NEGATIVE
Cryptosporidium, PCR: NEGATIVE
E coli (STEC) stx1/stx2, PCR: NEGATIVE
Giardia lamblia, PCR: NEGATIVE
Norovirus, PCR: NEGATIVE
Rotavirus A, PCR: NEGATIVE

## 2013-08-16 ENCOUNTER — Emergency Department (HOSPITAL_COMMUNITY)
Admission: EM | Admit: 2013-08-16 | Discharge: 2013-08-16 | Disposition: A | Discharge status: Home / Self Care | Payer: Managed Care, Other (non HMO) | Attending: Emergency Medicine | Admitting: Emergency Medicine

## 2013-08-16 ENCOUNTER — Encounter (HOSPITAL_COMMUNITY): Payer: Self-pay | Admitting: Emergency Medicine

## 2013-08-16 ENCOUNTER — Emergency Department (HOSPITAL_COMMUNITY): Payer: Managed Care, Other (non HMO)

## 2013-08-16 DIAGNOSIS — E079 Disorder of thyroid, unspecified: Secondary | ICD-10-CM | POA: Insufficient documentation

## 2013-08-16 DIAGNOSIS — K219 Gastro-esophageal reflux disease without esophagitis: Secondary | ICD-10-CM | POA: Insufficient documentation

## 2013-08-16 DIAGNOSIS — Z79899 Other long term (current) drug therapy: Secondary | ICD-10-CM | POA: Insufficient documentation

## 2013-08-16 DIAGNOSIS — Z91040 Latex allergy status: Secondary | ICD-10-CM | POA: Insufficient documentation

## 2013-08-16 DIAGNOSIS — Z3202 Encounter for pregnancy test, result negative: Secondary | ICD-10-CM | POA: Insufficient documentation

## 2013-08-16 DIAGNOSIS — Z87442 Personal history of urinary calculi: Secondary | ICD-10-CM | POA: Insufficient documentation

## 2013-08-16 DIAGNOSIS — R109 Unspecified abdominal pain: Secondary | ICD-10-CM

## 2013-08-16 DIAGNOSIS — R110 Nausea: Secondary | ICD-10-CM | POA: Insufficient documentation

## 2013-08-16 DIAGNOSIS — G8929 Other chronic pain: Secondary | ICD-10-CM | POA: Insufficient documentation

## 2013-08-16 DIAGNOSIS — Z9089 Acquired absence of other organs: Secondary | ICD-10-CM | POA: Insufficient documentation

## 2013-08-16 DIAGNOSIS — F411 Generalized anxiety disorder: Secondary | ICD-10-CM | POA: Insufficient documentation

## 2013-08-16 DIAGNOSIS — Z9889 Other specified postprocedural states: Secondary | ICD-10-CM | POA: Insufficient documentation

## 2013-08-16 DIAGNOSIS — R1011 Right upper quadrant pain: Secondary | ICD-10-CM | POA: Insufficient documentation

## 2013-08-16 LAB — COMPREHENSIVE METABOLIC PANEL
ALT: 16 U/L (ref 0–35)
AST: 19 U/L (ref 0–37)
Albumin: 4.3 g/dL (ref 3.5–5.2)
Alkaline Phosphatase: 72 U/L (ref 39–117)
BUN: 9 mg/dL (ref 6–23)
CO2: 25 mEq/L (ref 19–32)
Calcium: 9.9 mg/dL (ref 8.4–10.5)
Chloride: 99 mEq/L (ref 96–112)
Creatinine, Ser: 0.73 mg/dL (ref 0.50–1.10)
GFR calc Af Amer: >90 mL/min (ref >90)
GFR calc non Af Amer: >90 mL/min (ref >90)
Glucose, Bld: 86 mg/dL (ref 70–99)
Potassium: 2.9 mEq/L — CL (ref 3.7–5.3)
Sodium: 142 mEq/L (ref 137–147)
Total Bilirubin: 0.4 mg/dL (ref 0.3–1.2)
Total Protein: 7.8 g/dL (ref 6.0–8.3)

## 2013-08-16 LAB — PREGNANCY, URINE: Preg Test, Ur: NEGATIVE

## 2013-08-16 LAB — URINALYSIS, ROUTINE W REFLEX MICROSCOPIC
Bilirubin Urine: NEGATIVE
Glucose, UA: NEGATIVE mg/dL
Ketones, ur: NEGATIVE mg/dL
Leukocytes, UA: NEGATIVE
Nitrite: NEGATIVE
Protein, ur: NEGATIVE mg/dL
Specific Gravity, Urine: 1.006 (ref 1.005–1.030)
Urobilinogen, UA: 0.2 mg/dL (ref 0.0–1.0)
pH: 7 (ref 5.0–8.0)

## 2013-08-16 LAB — LIPASE, BLOOD: Lipase: 35 U/L (ref 11–59)

## 2013-08-16 LAB — URINE MICROSCOPIC-ADD ON

## 2013-08-16 MED ORDER — IOHEXOL 300 MG/ML  SOLN: 50.0000 mL | Freq: Once | INTRAMUSCULAR | Status: DC | PRN

## 2013-08-16 MED ORDER — OXYCODONE-ACETAMINOPHEN 5-325 MG PO TABS
2.0000 | ORAL_TABLET | Freq: Once | ORAL | Status: AC
  Administered 2013-08-16: 2 via ORAL
  Filled 2013-08-16: qty 2

## 2013-08-16 MED ORDER — POTASSIUM CHLORIDE CRYS ER 20 MEQ PO TBCR
60.0000 meq | EXTENDED_RELEASE_TABLET | Freq: Once | ORAL | Status: AC
  Administered 2013-08-16: 60 meq via ORAL
  Filled 2013-08-16: qty 3

## 2013-08-16 MED ORDER — HYDROMORPHONE HCL PF 1 MG/ML IJ SOLN
1.0000 mg | Freq: Once | INTRAMUSCULAR | Status: AC
  Administered 2013-08-16: 1 mg via INTRAVENOUS
  Filled 2013-08-16: qty 1

## 2013-08-16 MED ORDER — PROMETHAZINE HCL 25 MG/ML IJ SOLN
25.0000 mg | Freq: Once | INTRAMUSCULAR | Status: AC
  Administered 2013-08-16: 25 mg via INTRAVENOUS
  Filled 2013-08-16 (×2): qty 1

## 2013-08-16 MED ORDER — PROMETHAZINE HCL 25 MG PO TABS: 25.0000 mg | ORAL_TABLET | Freq: Four times a day (QID) | ORAL | Status: DC | PRN

## 2013-08-16 MED ORDER — SODIUM CHLORIDE 0.9 % IV BOLUS (SEPSIS)
1000.0000 mL | Freq: Once | INTRAVENOUS | Status: AC
  Administered 2013-08-16: 1000 mL via INTRAVENOUS

## 2013-08-16 MED ORDER — ONDANSETRON HCL 4 MG/2ML IJ SOLN: 4.0000 mg | Freq: Once | INTRAMUSCULAR | Status: DC

## 2013-08-16 NOTE — ED Notes (Signed)
IV team paged to start another line due to patient c/o pain at IV site in upper left arm.  Existing site is normal in color, no swelling, fluids infusing.

## 2013-08-16 NOTE — ED Notes (Signed)
Made patient aware that we need a urine sample she stated that she is unable to give one at this time.

## 2013-08-16 NOTE — ED Notes (Signed)
Pt presents c/o of sharp abdominal pain, weakness, chills, nausea and diarrhea. Pt symptoms onset was today and progressively worsening.

## 2013-08-16 NOTE — Discharge Instructions (Signed)
Abdominal Pain, Adult °Many things can cause abdominal pain. Usually, abdominal pain is not caused by a disease and will improve without treatment. It can often be observed and treated at home. Your health care provider will do a physical exam and possibly order blood tests and X-rays to help determine the seriousness of your pain. However, in many cases, more time must pass before a clear cause of the pain can be found. Before that point, your health care provider may not know if you need more testing or further treatment. °HOME CARE INSTRUCTIONS  °Monitor your abdominal pain for any changes. The following actions may help to alleviate any discomfort you are experiencing: °· Only take over-the-counter or prescription medicines as directed by your health care provider. °· Do not take laxatives unless directed to do so by your health care provider. °· Try a clear liquid diet (broth, tea, or water) as directed by your health care provider. Slowly move to a bland diet as tolerated. °SEEK MEDICAL CARE IF: °· You have unexplained abdominal pain. °· You have abdominal pain associated with nausea or diarrhea. °· You have pain when you urinate or have a bowel movement. °· You experience abdominal pain that wakes you in the night. °· You have abdominal pain that is worsened or improved by eating food. °· You have abdominal pain that is worsened with eating fatty foods. °SEEK IMMEDIATE MEDICAL CARE IF:  °· Your pain does not go away within 2 hours. °· You have a fever. °· You keep throwing up (vomiting). °· Your pain is felt only in portions of the abdomen, such as the right side or the left lower portion of the abdomen. °· You pass bloody or black tarry stools. °MAKE SURE YOU: °· Understand these instructions.   °· Will watch your condition.   °· Will get help right away if you are not doing well or get worse.   °Document Released: 03/13/2005 Document Revised: 03/24/2013 Document Reviewed: 02/10/2013 °[employer]® Patient  Information ©2014 [employer]. ° °

## 2013-08-16 NOTE — ED Notes (Signed)
Potassium 2.9 critical lab result.

## 2013-08-20 NOTE — ED Provider Notes (Signed)
CSN: [id number]     Arrival date & time 08/16/13  1616 History   First MD Initiated Contact with Patient 08/16/13 1637     Chief Complaint  Patient presents with  . Abdominal Pain     (Consider location/radiation/quality/duration/timing/severity/associated sxs/prior Treatment) HPI  38 year old female with abdominal pain. Gradual onset yesterday and progressively worsening. Describes the pain as sharp in her right upper quadrant in her epigastrium. Waxes and wanes, but does not completely go away. No appreciable exacerbating or relieving factors. Associated with nausea, but no vomiting. Diarrhea. Generalized weakness. No fever, but has felt like she's had chills. Status post cholecystectomy. No urinary complaints. No unusual vaginal bleeding or discharge.  Past Medical History  Diagnosis Date  . GERD (gastroesophageal reflux disease)   . Anxiety   . Chronic headaches   . Thyroid disease   . History of kidney stones   . PCOS (polycystic ovarian syndrome)     Takes metformin   . Gallstones    Past Surgical History  Procedure Laterality Date  . Cesarean section    . Cholecystectomy    . Cholecystectomy N/A 02/12/2013    Procedure: LAPAROSCOPIC CHOLECYSTECTOMY WITH INTRAOPERATIVE CHOLANGIOGRAM;  Surgeon: Romie LeveeAlicia Thomas, MD;  Location: [facility];  Service: General;  Laterality: N/A;  . Ercp  02/13/2013    Dr. Madilyn FiremanHayes   Family History  Problem Relation Age of Onset  . Colon cancer Neg Hx   . Crohn's disease Brother   . Irritable bowel syndrome Mother   . Diabetes Father   . Hypertension Mother   . Hypertension Father   . Hypertension Father    History  Substance Use Topics  . Smoking status: Never Smoker   . Smokeless tobacco: Never Used  . Alcohol Use: No   OB History   Grav Para Term Preterm Abortions TAB SAB Ect Mult Living                 Review of Systems  All systems reviewed and negative, other than as noted in HPI.   Allergies  Ciprofloxacin in d5w; Iodine;  Betadine; and Latex  Home Medications   Current Outpatient Rx  Name  Route  Sig  Dispense  Refill  . ALPRAZolam (XANAX) 0.5 MG tablet   Oral   Take 1 mg by mouth at bedtime.          Marland Kitchen. amLODipine (NORVASC) 5 MG tablet   Oral   Take 5 mg by mouth every evening.         . cholecalciferol (VITAMIN D) 1000 UNITS tablet   Oral   Take 1,000 Units by mouth daily.         . lansoprazole (PREVACID) 30 MG capsule   Oral   Take 30 mg by mouth daily.          Marland Kitchen. levothyroxine (SYNTHROID, LEVOTHROID) 100 MCG tablet   Oral   Take 100 mcg by mouth daily before breakfast.         . methocarbamol (ROBAXIN) 500 MG tablet   Oral   Take 500 mg by mouth 4 (four) times daily as needed (for muscle spasms).          . Omega-3 Fatty Acids (FISH OIL) 1000 MG CAPS   Oral   Take 1 capsule by mouth every morning.          . phentermine (ADIPEX-P) 37.5 MG tablet   Oral   Take 37.5 mg by mouth daily before breakfast.         .  Prenatal Vit-Fe Fumarate-FA (PRENATAL MULTIVITAMIN) TABS tablet   Oral   Take 1 tablet by mouth every morning.         . promethazine (PHENERGAN) 25 MG tablet   Oral   Take 1 tablet (25 mg total) by mouth every 6 (six) hours as needed for nausea or vomiting.   30 tablet   0   . saccharomyces boulardii (FLORASTOR) 250 MG capsule   Oral   Take 250 mg by mouth daily.         . SUMAtriptan-naproxen (TREXIMET) 85-500 MG per tablet   Oral   Take 1 tablet by mouth every 2 (two) hours as needed for migraine.         . triamterene-hydrochlorothiazide (MAXZIDE-25) 37.5-25 MG per tablet   Oral   Take 1 tablet by mouth every morning.         . promethazine (PHENERGAN) 25 MG tablet   Oral   Take 1 tablet (25 mg total) by mouth every 6 (six) hours as needed for nausea or vomiting.   15 tablet   0    BP 143/76  Pulse 81  Temp(Src) 98.2 F (36.8 C) (Oral)  Resp 18  SpO2 99%  LMP 08/10/2013 Physical Exam  Nursing note and vitals  reviewed. Constitutional: She appears well-developed and well-nourished. No distress.  Laying in bed. No acute distress. Obese.  HENT:  Head: Normocephalic and atraumatic.  Eyes: Conjunctivae are normal. Right eye exhibits no discharge. Left eye exhibits no discharge.  Neck: Neck supple.  Cardiovascular: Normal rate, regular rhythm and normal heart sounds.  Exam reveals no gallop and no friction rub.   No murmur heard. Pulmonary/Chest: Effort normal and breath sounds normal. No respiratory distress.  Abdominal: Soft. She exhibits no distension. There is tenderness. There is no rebound and no guarding.  Mild diffuse tenderness, perhaps worse in the epigastrium. No rebound or guarding. No distention.  Musculoskeletal: She exhibits no edema and no tenderness.  Neurological: She is alert.  Skin: Skin is warm and dry. She is not diaphoretic.  Psychiatric: She has a normal mood and affect. Her behavior is normal. Thought content normal.    ED Course  Procedures (including critical care time) Labs Review Labs Reviewed  URINALYSIS, ROUTINE W REFLEX MICROSCOPIC - Abnormal; Notable for the following:    Hgb urine dipstick TRACE (*)    All other components within normal limits  COMPREHENSIVE METABOLIC PANEL - Abnormal; Notable for the following:    Potassium 2.9 (*)    All other components within normal limits  LIPASE, BLOOD  PREGNANCY, URINE  URINE MICROSCOPIC-ADD ON   Imaging Review No results found.  Ct Abdomen Pelvis Wo Contrast  08/16/2013   CLINICAL DATA:  Mid abdominal pain post cholecystectomy August 2014.  EXAM: CT ABDOMEN AND PELVIS WITHOUT CONTRAST  TECHNIQUE: Multidetector CT imaging of the abdomen and pelvis was performed following the standard protocol without intravenous contrast.  COMPARISON:  03/18/2013.  FINDINGS: Portions of colon under distended. No extra luminal bowel inflammatory process, free fluid or free air.  Post cholecystectomy.  Elongated liver spanning over 18.8  cm. Taking into account limitation by non contrast imaging, no worrisome hepatic, splenic, pancreatic, adrenal or renal lesion.  No abdominal aortic aneurysm. No adenopathy. No bony destructive lesion.  No worrisome adnexal or uterine abnormality. Urinary bladder unremarkable.  IMPRESSION: No acute abnormality.  Please see above.   Electronically Signed   By: Bridgett Larsson M.D.   On: 08/16/2013 21:59  EKG Interpretation None      MDM   Final diagnoses:  Abdominal pain    38 year old female with abdominal pain. Mild tenderness on exam. Workup is pretty unremarkable aside from hypokalemia. Patient is tolerating PO. Pain controlled, although she is requesting additional pain medication on discharge. Per review of the controlled substance database, patient regularly prescribed a large quantity of oxycodone. Return precautions discussed. Outpt FU otherwise.     Raeford Razor, MD 08/20/13 [id number]

## 2013-08-26 ENCOUNTER — Telehealth: Payer: Self-pay | Admitting: Physician Assistant

## 2013-08-26 NOTE — Telephone Encounter (Signed)
Unable to reach patient. No answering machine. Phone just rings.

## 2013-08-26 NOTE — Telephone Encounter (Signed)
Patient went to ED on 08/16/13 with diarrhea, nausea and vomiting. She is still only able to do very bland diet. Mainly liquids and crackers. Scheduled with Mike GipAmy Esterwood, PA tomorrow at 2:00 PM for f/u.

## 2013-08-27 ENCOUNTER — Encounter: Payer: Self-pay | Admitting: Physician Assistant

## 2013-08-27 ENCOUNTER — Other Ambulatory Visit (INDEPENDENT_AMBULATORY_CARE_PROVIDER_SITE_OTHER): Payer: Managed Care, Other (non HMO)

## 2013-08-27 ENCOUNTER — Ambulatory Visit: Payer: Managed Care, Other (non HMO) | Admitting: Physician Assistant

## 2013-08-27 ENCOUNTER — Ambulatory Visit (INDEPENDENT_AMBULATORY_CARE_PROVIDER_SITE_OTHER): Payer: Managed Care, Other (non HMO) | Admitting: Physician Assistant

## 2013-08-27 VITALS — BP 130/80 | HR 78 | Ht 64.5 in | Wt 283.4 lb

## 2013-08-27 DIAGNOSIS — R109 Unspecified abdominal pain: Secondary | ICD-10-CM

## 2013-08-27 DIAGNOSIS — K5289 Other specified noninfective gastroenteritis and colitis: Secondary | ICD-10-CM

## 2013-08-27 DIAGNOSIS — R197 Diarrhea, unspecified: Secondary | ICD-10-CM

## 2013-08-27 DIAGNOSIS — R112 Nausea with vomiting, unspecified: Secondary | ICD-10-CM

## 2013-08-27 LAB — CBC WITH DIFFERENTIAL/PLATELET
BASOS ABS: 0 10*3/uL (ref 0.0–0.1)
BASOS PCT: 0.5 % (ref 0.0–3.0)
EOS ABS: 0 10*3/uL (ref 0.0–0.7)
Eosinophils Relative: 0.8 % (ref 0.0–5.0)
HCT: 39.9 % (ref 36.0–46.0)
Hemoglobin: 13.1 g/dL (ref 12.0–15.0)
LYMPHS PCT: 33.7 % (ref 12.0–46.0)
Lymphs Abs: 1.8 10*3/uL (ref 0.7–4.0)
MCHC: 33 g/dL (ref 30.0–36.0)
MCV: 93.3 fl (ref 78.0–100.0)
MONO ABS: 0.4 10*3/uL (ref 0.1–1.0)
Monocytes Relative: 6.8 % (ref 3.0–12.0)
NEUTROS PCT: 58.2 % (ref 43.0–77.0)
Neutro Abs: 3.1 10*3/uL (ref 1.4–7.7)
PLATELETS: 229 10*3/uL (ref 150.0–400.0)
RBC: 4.27 Mil/uL (ref 3.87–5.11)
RDW: 13 % (ref 11.5–14.6)
WBC: 5.3 10*3/uL (ref 4.5–10.5)

## 2013-08-27 LAB — BASIC METABOLIC PANEL
BUN: 6 mg/dL (ref 6–23)
CALCIUM: 9.3 mg/dL (ref 8.4–10.5)
CHLORIDE: 101 meq/L (ref 96–112)
CO2: 27 meq/L (ref 19–32)
Creatinine, Ser: 0.7 mg/dL (ref 0.4–1.2)
GFR: 92.59 mL/min (ref >60.00)
GLUCOSE: 102 mg/dL — AB (ref 70–99)
Potassium: 3.7 mEq/L (ref 3.5–5.1)
Sodium: 138 mEq/L (ref 135–145)

## 2013-08-27 MED ORDER — GLYCOPYRROLATE 2 MG PO TABS: ORAL_TABLET | ORAL | Status: DC

## 2013-08-27 MED ORDER — POTASSIUM CHLORIDE ER 10 MEQ PO TBCR: EXTENDED_RELEASE_TABLET | ORAL | Status: DC

## 2013-08-27 NOTE — Progress Notes (Signed)
Subjective:    Patient ID: Vanessa [name], female    DOB: 01-17-1976, 38 y.o.   MRN: [id number]  HPI  Vanessa [name] is a pleasant 38 year old white female known to Dr. Deatra Ina. She has history of hypertension hypothyroidism ureteral lithiasis and obesity. She underwent a laparoscopic cholecystectomy in August of 2014 for acute cholecystitis. IOC was positive and she then underwent ERCP sphincterotomy with Dr. Amedeo Plenty on 02/13/2013, no definite stone found at ERCP. Patient was last seen in November of 2014 when she had an episode of diarrhea fatigue nausea and vomiting. She had GI pathogen panel done which was negative. There was concern for C. difficile and she was empirically treated with vancomycin.  Patient says she had a similar milder episode a few weeks ago and now comes in after an ER visit on 08/16/2013 when she presented with diarrhea and nausea. Patient says she had acute onset the day before with severe crampy abdominal pain followed by multiple episodes of watery diarrheal stool which was nonbloody. She says she had hot and cold spells and chills but no documented fever. She had no appetite was nauseated without vomiting and significant weakness. She was seen and evaluated in the ER by the ER physician; labs were unremarkable with the exception of a potassium of 2.9. She recently received fluid replacement and oral potassium and had a CT scan done of the abdomen and pelvis which was unremarkable.  She says she felt better for several days after that -her diarrhea gradually resolved. She says her appetite is still very poor and she's been eating her small amounts of bland food. She still feels achy and weak . Her last course of antibiotics was in January.    Review of Systems  Constitutional: Positive for chills, appetite change and fatigue.  HENT: Negative.   Eyes: Negative.   Respiratory: Negative.   Cardiovascular: Negative.   Gastrointestinal: Positive for nausea, abdominal pain and  diarrhea.  Endocrine: Negative.   Genitourinary: Negative.   Musculoskeletal: Negative.   Skin: Negative.   Allergic/Immunologic: Negative.   Neurological: Positive for weakness.  Hematological: Negative.   Psychiatric/Behavioral: Negative.    Outpatient Prescriptions Prior to Visit  Medication Sig Dispense Refill  . ALPRAZolam (XANAX) 0.5 MG tablet Take 1 mg by mouth at bedtime.       Marland Kitchen amLODipine (NORVASC) 5 MG tablet Take 5 mg by mouth every evening.      . cholecalciferol (VITAMIN D) 1000 UNITS tablet Take 1,000 Units by mouth daily.      . lansoprazole (PREVACID) 30 MG capsule Take 30 mg by mouth daily.       Marland Kitchen levothyroxine (SYNTHROID, LEVOTHROID) 100 MCG tablet Take 100 mcg by mouth daily before breakfast.      . methocarbamol (ROBAXIN) 500 MG tablet Take 500 mg by mouth 4 (four) times daily as needed (for muscle spasms).       . Omega-3 Fatty Acids (FISH OIL) 1000 MG CAPS Take 1 capsule by mouth every morning.       . phentermine (ADIPEX-P) 37.5 MG tablet Take 37.5 mg by mouth daily before breakfast.      . Prenatal Vit-Fe Fumarate-FA (PRENATAL MULTIVITAMIN) TABS tablet Take 1 tablet by mouth every morning.      . promethazine (PHENERGAN) 25 MG tablet Take 1 tablet (25 mg total) by mouth every 6 (six) hours as needed for nausea or vomiting.  30 tablet  0  . promethazine (PHENERGAN) 25 MG tablet Take 1 tablet (25 mg total)  by mouth every 6 (six) hours as needed for nausea or vomiting.  15 tablet  0  . saccharomyces boulardii (FLORASTOR) 250 MG capsule Take 250 mg by mouth daily.      . SUMAtriptan-naproxen (TREXIMET) 85-500 MG per tablet Take 1 tablet by mouth every 2 (two) hours as needed for migraine.      . triamterene-hydrochlorothiazide (MAXZIDE-25) 37.5-25 MG per tablet Take 1 tablet by mouth every morning.       No facility-administered medications prior to visit.   Allergies  Allergen Reactions  . Ciprofloxacin In D5w Rash    Pt states developed rash on Lt arm when IV  Cipro infusing  . Iodine   . Betadine [Povidone Iodine] Hives, Itching and Rash  . Latex Hives, Itching and Rash   Patient Active Problem List   Diagnosis Date Noted  . S/P laparoscopic cholecystectomy 04/20/2013  . Nonspecific (abnormal) findings on radiological and other examination of biliary tract 02/12/2013  . Nephrolithiasis 02/10/2013  . Hypothyroidism 02/10/2013  . HTN (hypertension) 02/10/2013  . Odynophagia 10/30/2011  . Obesity 10/30/2011   History  Substance Use Topics  . Smoking status: Never Smoker   . Smokeless tobacco: Never Used  . Alcohol Use: No   family history includes Crohn's disease in her brother; Diabetes in her father; Hypertension in her father, father, and mother; Irritable bowel syndrome in her mother. There is no history of Colon cancer.     Objective:   Physical Exam  obese white female in no acute distress, quite pleasant blood pressure 130/80 pulse 78 height 5 foot 4 weight 283, BMI 48.8. HEENT; nontraumatic normocephalic EOMI PERRLA sclera anicteric, Supple ;no JVD, Cardiovascular; regular rate and rhythm with S1-S2 no murmur or gallop, Pulmonary ;clear bilaterally, Abdomen; large soft she is mild rather generalized tenderness there is no guarding or rebound no palpable mass or hepatosplenomegaly bowel sounds are present her incisional ports are benign, Rectal; exam not done, Exts; no clubbing cyanosis or edema skin warm dry, Psych; mood and affect appropriate        Assessment & Plan:  #40  38 year old female with an acute illness with intense crampy abdominal pain diarrhea and nausea onset on 08/16/2013, symptomatically improved but not resolved at this time. This is most consistent with a viral gastroenteritis Labs done through the emergency room were unremarkable with the exception of a hypokalemia #2 hypokalemia secondary to diarrhea #3 status post cholecystectomy and ERCP with sphincterotomy August 2014 #4 obesity #5 hypertension #6  hypothyroidism  Plan; CBC with differential and we'll repeat be met today GI pathogen panel if diarrhea recurs Start Robinul Forte 2 mg by mouth twice daily over the next week or so and taper as she improves Will give her K-Dur 10 milligrams  twice daily for 3 days Phenergan 12.5 mg every 6 hours when necessary for nausea Followup with Dr. Deatra Ina or myself as needed

## 2013-08-27 NOTE — Patient Instructions (Signed)
Please go to the basement level to have your labs drawn.  Take 1 probiotic daily . Take 1/2 phenergan  During the day every 6-8 hours as needed for nausea.  Bland diet for now. Call and make an appointment if your symptoms worsen.

## 2013-08-30 ENCOUNTER — Encounter: Payer: Self-pay | Admitting: Physician Assistant

## 2013-08-30 NOTE — Progress Notes (Signed)
Reviewed and agree with management. Robert D. Kaplan, M.D., FACG  

## 2013-09-02 ENCOUNTER — Encounter: Payer: Self-pay | Admitting: *Deleted

## 2013-11-04 ENCOUNTER — Other Ambulatory Visit: Payer: Self-pay | Admitting: Obstetrics and Gynecology

## 2014-01-23 IMAGING — CT CT ABD-PELV W/ CM
1 of 3 series · 15 of 32 positions shown, 20 images · IV contrast (omnipaque)
Comparison: 02/23/2013

CLINICAL DATA: Mid abdominal pain.

EXAM:
CT ABDOMEN AND PELVIS WITH CONTRAST
TECHNIQUE: Multidetector CT imaging of the abdomen and pelvis was performed
using the standard protocol following bolus administration of
intravenous contrast.
CONTRAST:  100mL OMNIPAQUE IOHEXOL 300 MG/ML  SOLN

[Series 2: abd/pel with · axial · 0.79mm/px · z∈[+362,+767]mm · 15 of 91 slices shown, 20 images]
[im 5/91  soft-tissue]
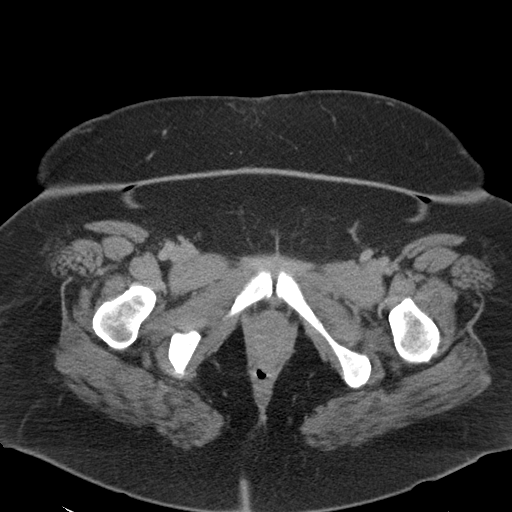
[im 5/91  bone]
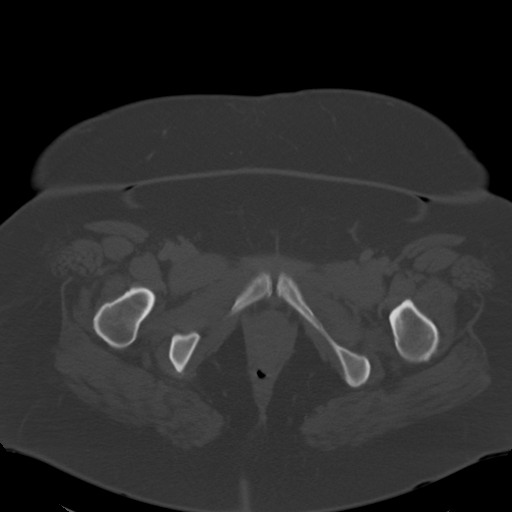
[im 10/91  soft-tissue]
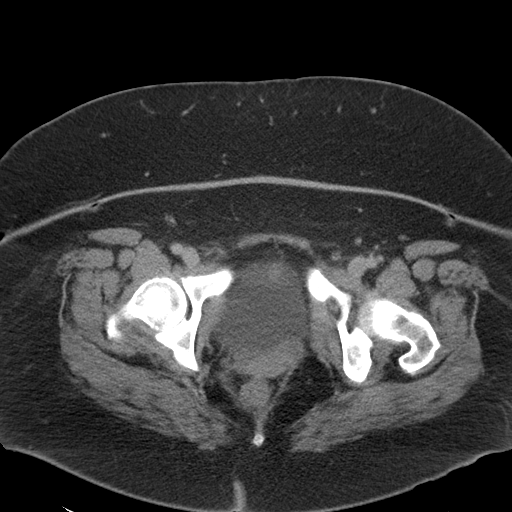
[im 19/91  soft-tissue]
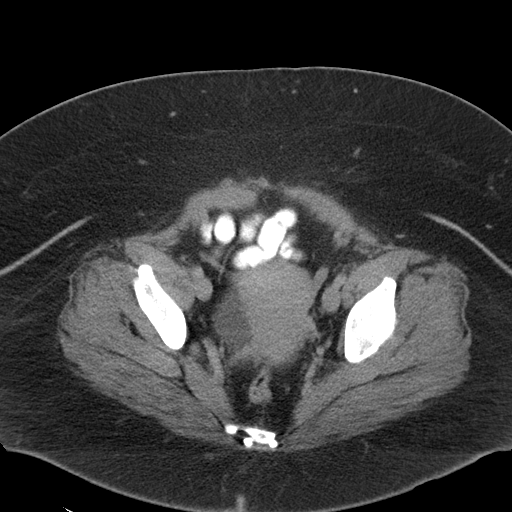
[im 24/91  soft-tissue]
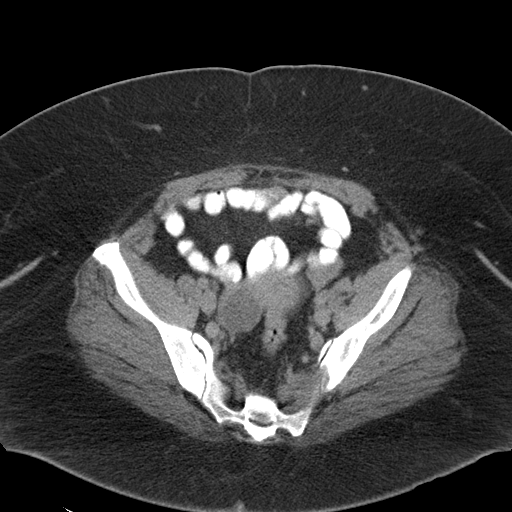
[im 29/91  soft-tissue]
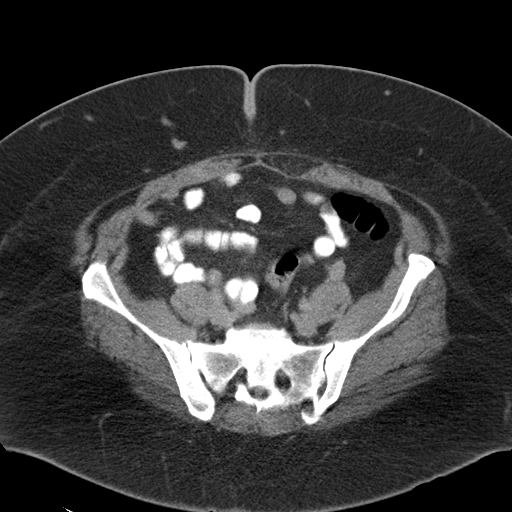
[im 38/91  soft-tissue]
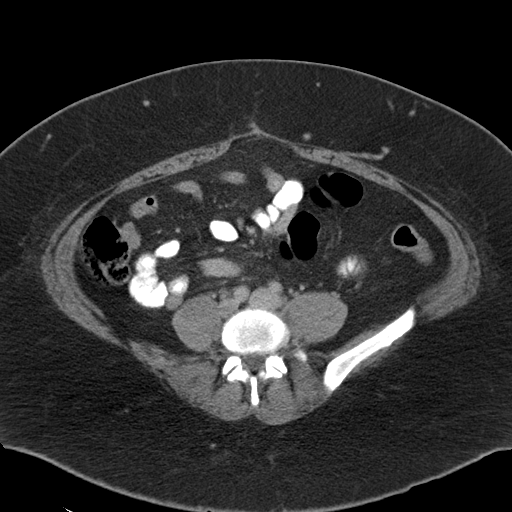
[im 43/91  soft-tissue]
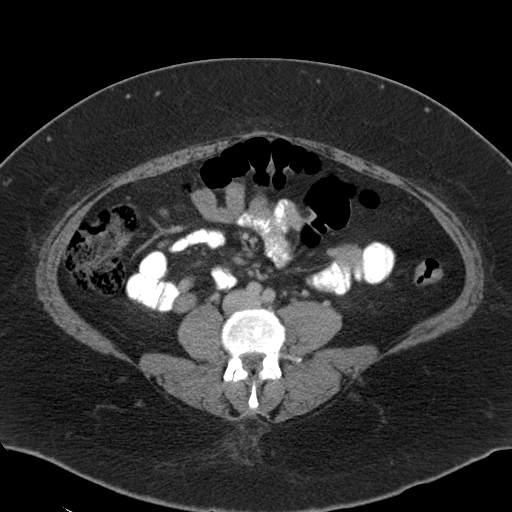
[im 48/91  soft-tissue]
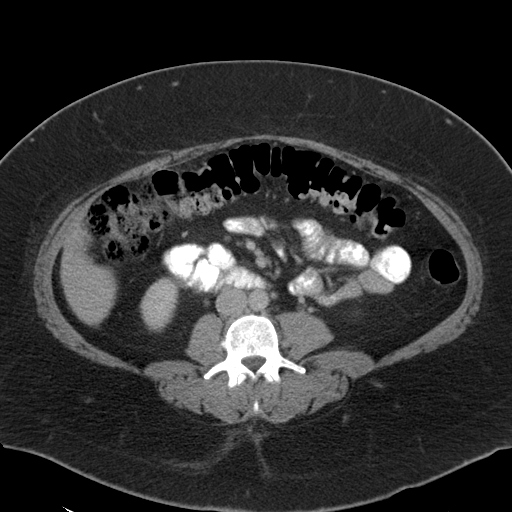
[im 53/91  soft-tissue]
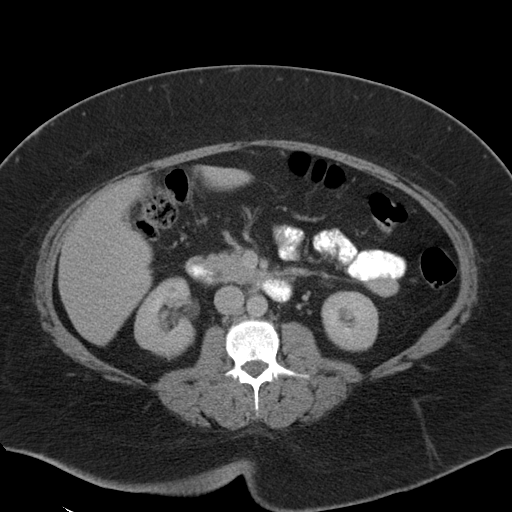
[im 53/91  bone]
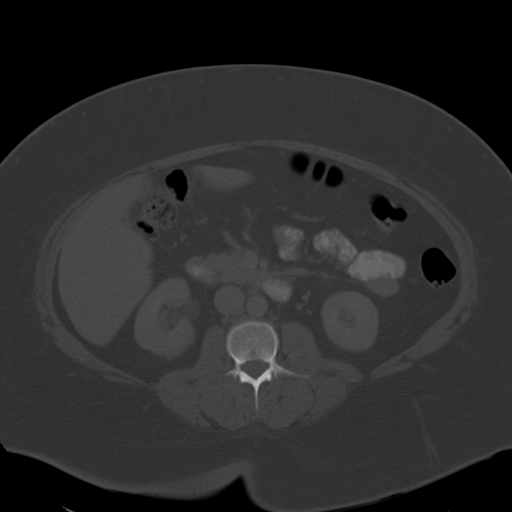
[im 62/91  soft-tissue]
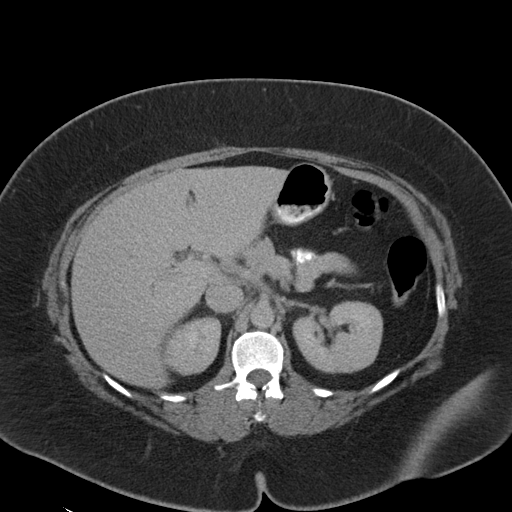
[im 67/91  soft-tissue]
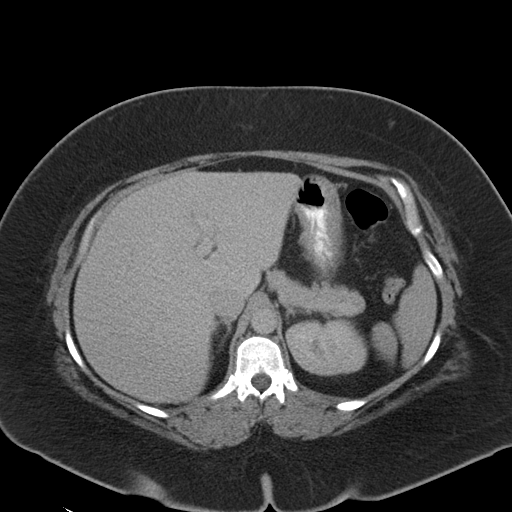
[im 72/91  soft-tissue]
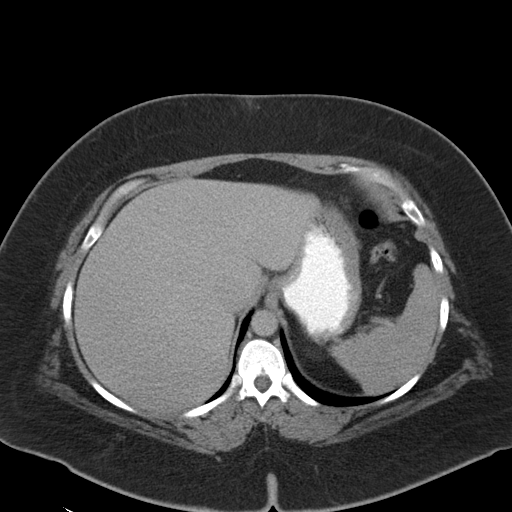
[im 72/91  lung]
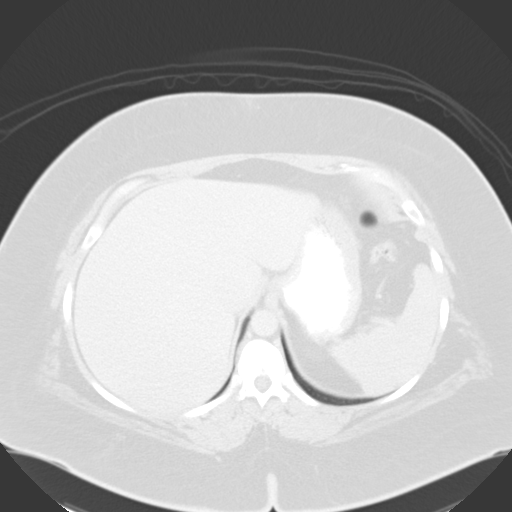
[im 76/91  lung]
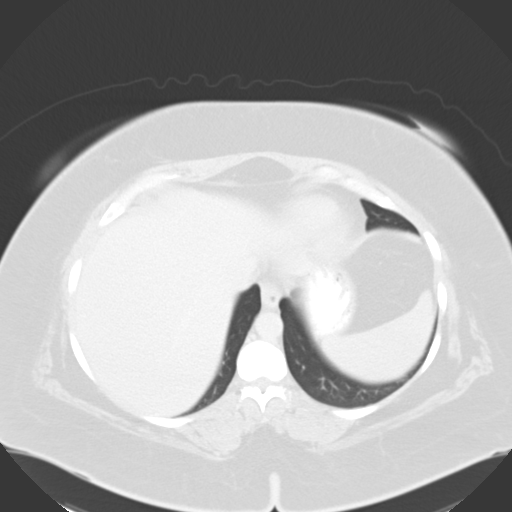
[im 81/91  soft-tissue]
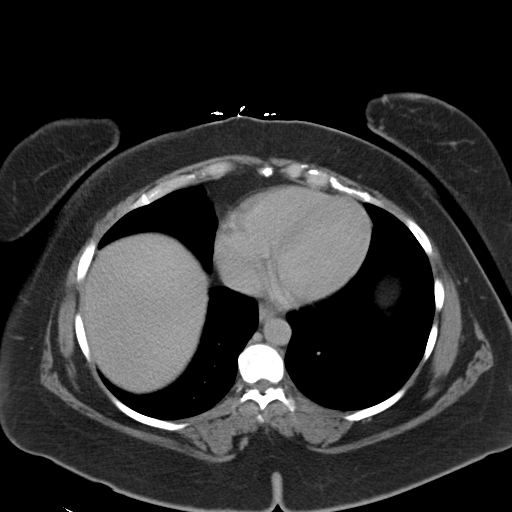
[im 81/91  lung]
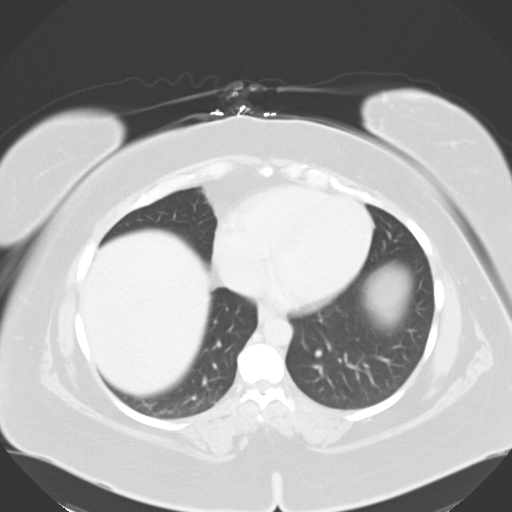
[im 86/91  soft-tissue]
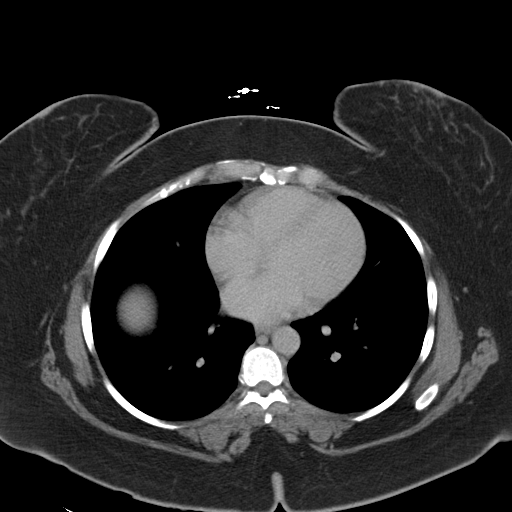
[im 86/91  lung]
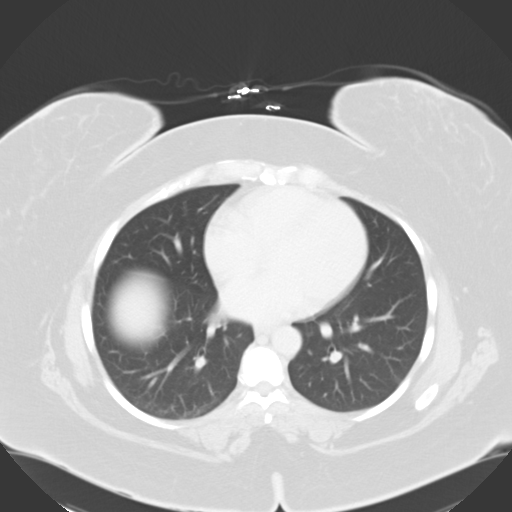

[15 of 32 positions shown; findings below may reference images not displayed]

FINDINGS: Lung bases are clear. No effusions. Heart is normal size.

Prior cholecystectomy. Liver, spleen, pancreas, adrenals and kidneys
are unremarkable.

Urinary bladder, uterus and ovaries unremarkable. Bowel grossly
unremarkable. No free fluid, free air, or adenopathy. No acute bony
abnormality.
IMPRESSION: No acute findings in the abdomen or pelvis.

## 2014-04-24 ENCOUNTER — Telehealth: Payer: Self-pay | Admitting: Gastroenterology

## 2014-04-24 NOTE — Telephone Encounter (Signed)
Pt with onset of watery diarrhea and crampy abdominal pain. She is under stress due to her fathers cancer diagnosis and has unresolved issues regarding death of her child which are resurfacing with fathers illness. Resume Robinul 2 mg po bid as needed, Imodium qid as needed, clear liquids tonight, call office for appt. ED if symptoms worsen.

## 2014-04-25 ENCOUNTER — Telehealth: Payer: Self-pay | Admitting: Gastroenterology

## 2014-04-25 NOTE — Telephone Encounter (Signed)
Patient talked with on call doctor last night. Diarrhea and painful abdominal cramps. Under a constant stress and feels it is affecting her gi tract. She is taking Robinal BID, using imodium and eating a BRAT diet. Appointment made for her 04/28/14 as requested but will watch for sooner appointment. She is afebrile, no bloody stools or vomiting.

## 2014-04-28 ENCOUNTER — Other Ambulatory Visit (INDEPENDENT_AMBULATORY_CARE_PROVIDER_SITE_OTHER): Payer: Managed Care, Other (non HMO)

## 2014-04-28 ENCOUNTER — Ambulatory Visit (INDEPENDENT_AMBULATORY_CARE_PROVIDER_SITE_OTHER): Payer: Managed Care, Other (non HMO) | Admitting: Physician Assistant

## 2014-04-28 ENCOUNTER — Encounter: Payer: Self-pay | Admitting: Physician Assistant

## 2014-04-28 VITALS — BP 144/86 | HR 84 | Ht 63.0 in | Wt 298.2 lb

## 2014-04-28 DIAGNOSIS — R1084 Generalized abdominal pain: Secondary | ICD-10-CM

## 2014-04-28 DIAGNOSIS — R197 Diarrhea, unspecified: Secondary | ICD-10-CM

## 2014-04-28 DIAGNOSIS — R112 Nausea with vomiting, unspecified: Secondary | ICD-10-CM

## 2014-04-28 DIAGNOSIS — Z7189 Other specified counseling: Secondary | ICD-10-CM

## 2014-04-28 DIAGNOSIS — F411 Generalized anxiety disorder: Secondary | ICD-10-CM

## 2014-04-28 LAB — CBC WITH DIFFERENTIAL/PLATELET
BASOS PCT: 0.3 % (ref 0.0–3.0)
Basophils Absolute: 0 10*3/uL (ref 0.0–0.1)
EOS ABS: 0.4 10*3/uL (ref 0.0–0.7)
Eosinophils Relative: 3.2 % (ref 0.0–5.0)
HCT: 41.4 % (ref 36.0–46.0)
Hemoglobin: 13.5 g/dL (ref 12.0–15.0)
LYMPHS PCT: 27.4 % (ref 12.0–46.0)
Lymphs Abs: 3.7 10*3/uL (ref 0.7–4.0)
MCHC: 32.7 g/dL (ref 30.0–36.0)
MCV: 89.9 fl (ref 78.0–100.0)
MONOS PCT: 4 % (ref 3.0–12.0)
Monocytes Absolute: 0.5 10*3/uL (ref 0.1–1.0)
NEUTROS PCT: 65.1 % (ref 43.0–77.0)
Neutro Abs: 8.8 10*3/uL — ABNORMAL HIGH (ref 1.4–7.7)
Platelets: 279 10*3/uL (ref 150.0–400.0)
RBC: 4.6 Mil/uL (ref 3.87–5.11)
RDW: 13.2 % (ref 11.5–15.5)
WBC: 13.5 10*3/uL — ABNORMAL HIGH (ref 4.0–10.5)

## 2014-04-28 MED ORDER — ALPRAZOLAM 0.5 MG PO TABS: ORAL_TABLET | ORAL | Status: DC

## 2014-04-28 MED ORDER — GLYCOPYRROLATE 2 MG PO TABS: 2.0000 mg | ORAL_TABLET | Freq: Two times a day (BID) | ORAL | Status: DC

## 2014-04-28 MED ORDER — SACCHAROMYCES BOULARDII 250 MG PO CAPS: ORAL_CAPSULE | ORAL | Status: DC

## 2014-04-28 NOTE — Patient Instructions (Addendum)
Please go to the basement level to have your labs drawn.  We sent prescriptions to [facility] [address] 1. Robinul forte ( glycopyrolate)  2. Florastor  We have given you a prescription for Xanax .5mg .  Take 1/2 to 1 tab twice daily as needed.  Call Behavioral Health at [phone] for an appointment.

## 2014-04-29 ENCOUNTER — Encounter: Payer: Self-pay | Admitting: Physician Assistant

## 2014-04-29 LAB — COMPREHENSIVE METABOLIC PANEL
ALBUMIN: 3.5 g/dL (ref 3.5–5.2)
ALT: 16 U/L (ref 0–35)
AST: 23 U/L (ref 0–37)
Alkaline Phosphatase: 86 U/L (ref 39–117)
BUN: 8 mg/dL (ref 6–23)
CALCIUM: 9.2 mg/dL (ref 8.4–10.5)
CHLORIDE: 101 meq/L (ref 96–112)
CO2: 29 mEq/L (ref 19–32)
Creatinine, Ser: 0.8 mg/dL (ref 0.4–1.2)
GFR: 84.33 mL/min (ref >60.00)
Glucose, Bld: 106 mg/dL — ABNORMAL HIGH (ref 70–99)
POTASSIUM: 3.5 meq/L (ref 3.5–5.1)
Sodium: 137 mEq/L (ref 135–145)
Total Bilirubin: 0.3 mg/dL (ref 0.2–1.2)
Total Protein: 7.2 g/dL (ref 6.0–8.3)

## 2014-04-29 NOTE — Progress Notes (Signed)
Subjective:    Patient ID: Vanessa [name], female    DOB: 1976-03-24, 38 y.o.   MRN: [id number]  HPI  Vanessa [name] is a pleasant 38 year old female known to Dr. Arlyce DiceKaplan. She has history of morbid obesity, hypertension, hypothyroidism, ureterolithiasis and is status post cholecystectomy then the ERCP with extraction of common bile duct stones in 2014. She came in after an episode of intense crampy abdominal pain in March 2015 and had CT of the abdomen and pelvis which was negative. He had last undergone an EGD in 2013 and this was normal. She is felt to have IBS. Pacing comes in today stating that she had been doing well for a long period of time and stopped taking Robinul and probiotic which we had given her previously. Over this last weekend she had an episode of what she describes as hard crampy contraction-like abdominal pain followed by diarrhea and nausea and vomiting. Her symptoms gradually subsided over the next 24 hours he says she still has some abdominal discomfort and actually vomited once yesterday had any further diarrhea no fever chills etc. She admits that she's been incredibly stressed over the past week or so after learning that her father who is only 4060 and had been very healthy has just been diagnosed with widely metastatic cancer. He has not been given much help her survival and she says she's already grieving his loss. Patient also had a child, I believe at age 108 die , 5 or 6 years ago and this has caused her ongoing problems with anxiety and depression. She states she feels like her whole body is revolting,at this time and that it's hard to describe.she says her father's illness is making her relive her small child's illness and death.    Review of Systems  Constitutional: Positive for appetite change and fatigue.  HENT: Negative.   Eyes: Negative.   Respiratory: Negative.   Cardiovascular: Negative.   Gastrointestinal: Positive for nausea, vomiting, abdominal pain and diarrhea.    Endocrine: Negative.   Genitourinary: Negative.   Musculoskeletal: Negative.   Allergic/Immunologic: Negative.   Neurological: Negative.   Hematological: Negative.   Psychiatric/Behavioral: Positive for dysphoric mood and decreased concentration. The patient is nervous/anxious.    Outpatient Prescriptions Prior to Visit  Medication Sig Dispense Refill  . amLODipine (NORVASC) 5 MG tablet Take 5 mg by mouth every morning.     . cholecalciferol (VITAMIN D) 1000 UNITS tablet Take 1,000 Units by mouth daily.    . lansoprazole (PREVACID) 30 MG capsule Take 30 mg by mouth 2 (two) times daily before a meal.     . levothyroxine (SYNTHROID, LEVOTHROID) 100 MCG tablet Take 100 mcg by mouth daily before breakfast.    . methocarbamol (ROBAXIN) 500 MG tablet Take 500 mg by mouth 4 (four) times daily as needed (for muscle spasms).     . Omega-3 Fatty Acids (FISH OIL) 1000 MG CAPS Take 1 capsule by mouth every morning.     . phentermine (ADIPEX-P) 37.5 MG tablet Take 37.5 mg by mouth daily before breakfast.    . Prenatal Vit-Fe Fumarate-FA (PRENATAL MULTIVITAMIN) TABS tablet Take 1 tablet by mouth every morning.    . promethazine (PHENERGAN) 25 MG tablet Take 1 tablet (25 mg total) by mouth every 6 (six) hours as needed for nausea or vomiting. 15 tablet 0  . saccharomyces boulardii (FLORASTOR) 250 MG capsule Take 250 mg by mouth daily.    . SUMAtriptan-naproxen (TREXIMET) 85-500 MG per tablet Take 1 tablet by mouth  every 2 (two) hours as needed for migraine.    . triamterene-hydrochlorothiazide (MAXZIDE-25) 37.5-25 MG per tablet Take 1 tablet by mouth every morning. As needed    . glycopyrrolate (ROBINUL) 2 MG tablet Take 1 tab twice daily (Patient taking differently: Take 1 tab twice daily as needed) 20 tablet 0  . ALPRAZolam (XANAX) 0.5 MG tablet Take 1 mg by mouth at bedtime.     . potassium chloride (K-DUR) 10 MEQ tablet Take 1 tab twice a day for 3 days. 6 tablet 0  . promethazine (PHENERGAN) 25 MG  tablet Take 1 tablet (25 mg total) by mouth every 6 (six) hours as needed for nausea or vomiting. 30 tablet 0   No facility-administered medications prior to visit.      Allergies  Allergen Reactions  . Ciprofloxacin In D5w Rash    Pt states developed rash on Lt arm when IV Cipro infusing  . Iodine   . Betadine [Povidone Iodine] Hives, Itching and Rash  . Latex Hives, Itching and Rash   Patient Active Problem List   Diagnosis Date Noted  . S/P laparoscopic cholecystectomy 04/20/2013  . Nonspecific (abnormal) findings on radiological and other examination of biliary tract 02/12/2013  . Nephrolithiasis 02/10/2013  . Hypothyroidism 02/10/2013  . HTN (hypertension) 02/10/2013  . Odynophagia 10/30/2011  . Obesity 10/30/2011   History  Substance Use Topics  . Smoking status: Never Smoker   . Smokeless tobacco: Never Used  . Alcohol Use: No   family history includes Cancer in her father; Crohn's disease in her brother; Diabetes in her father; Hypertension in her father and mother; Irritable bowel syndrome in her mother; Leukemia in her maternal grandfather; Thyroid cancer in her mother. There is no history of Colon cancer.  Objective:   Physical Exam  Well-developed white female in no acute distress, anxious, obese blood pressure 144/86 pulse 84 height 5 foot 3 weight 298, BMI 52. HEENT; nontraumatic, normocephalic EOMI PERRLA sclerae anicteric, Supple; no JVD, Cardiovascular; regular rate and rhythm with S1-S2 no murmur rub or gallop, Pulm;clear bilaterally, Abdomen ;obese ,soft mild generalized tenderness no guarding or rebound no palpable mass or hepatosplenomegaly bowel sounds are present, Rectal; exam not done, Ext; no clubbing cyanosis or edema skin warm dry,Psych; mood and affect distressed but appropriate        Assessment & Plan:  #441 38 year old female with history of IBS presenting with an episode 5 days ago of severe crampy abdominal pain nausea vomiting and diarrhea as  well as in the setting of just learning that her father has widely metastatic cancer. This patient has a lot of underlying ongoing anxiety and I believe her GI symptoms are functional and a visceral reaction to her father's illness. She has also experienced the death of a child a few years back and her father's illness is bringing back all of these painful memories #2 morbid obesity #3 hypertension #4 status post cholecystectomy and ERCP with extraction of common bile duct stones 2014  Plan; we'll check CBC and CMET today Restart Robinul Forte 2 mg by mouth twice daily Restart Florastor once daily Patient is already on Xanax at bedtime at fairly high dose which she's been on chronically. We will add Xanax 0.25-0.5 mg by mouth twice daily- plan fairly short term We had a long discussion about severe stress and its effects on the body, she will make an appointment to be seen in behavioral health/[place] for counseling and grief counseling. She'll follow up with Dr. Val Eagle myself  as needed

## 2014-05-02 NOTE — Progress Notes (Signed)
Reviewed and agree with management. Robert D. Kaplan, M.D., FACG  

## 2014-06-14 ENCOUNTER — Telehealth: Payer: Self-pay | Admitting: Physician Assistant

## 2014-06-14 MED ORDER — DIPHENOXYLATE-ATROPINE 2.5-0.025 MG PO TABS: ORAL_TABLET | ORAL | Status: DC

## 2014-06-14 NOTE — Telephone Encounter (Signed)
She may have rx for Lomotil ,one up to 4 times daily prn #40/0 refills

## 2014-06-14 NOTE — Telephone Encounter (Signed)
IBS flare. She is taking her robinul and her florastor daily she is taking 4 Imodium each day to try controlling her diarrhea. She has had 2 to 3 diarrhea stools today. No appetite, but no vomiting or nausea. Her stomach "grumbles" at lot. She is asking for Lomotil Rx. Please advise.

## 2014-06-14 NOTE — Telephone Encounter (Signed)
Patient advised. Given information about medlineplus.gov so she can research and learn about her IBS

## 2014-07-14 ENCOUNTER — Other Ambulatory Visit: Payer: Self-pay | Admitting: Physician Assistant

## 2014-11-01 ENCOUNTER — Telehealth: Payer: Self-pay | Admitting: Physician Assistant

## 2014-11-01 ENCOUNTER — Other Ambulatory Visit: Payer: Self-pay

## 2014-11-01 NOTE — Telephone Encounter (Signed)
Spoke with the patient. History of anxiety and she does have a lot of situational stress in her life. She is having a flare of diarrhea again from her IBS tendency. She was unsure if she could take Robinul every day. Encouraged to take it twice daily for this flare. Also, she will call me back with her progress in a few days.

## 2014-11-02 NOTE — Telephone Encounter (Signed)
Yes that sounds good, thanks

## 2014-11-27 ENCOUNTER — Telehealth: Payer: Self-pay | Admitting: Gastroenterology

## 2014-11-27 NOTE — Telephone Encounter (Signed)
On call note. IBS with diarrhea flaring for several weeks with crampy abd pain and watery diarrhea. Pt seems anxious, stressed-this has been ongoing. No significant changes over the past few days. Not responding to Robinul 2 mg bid. Pt using Lomotil sparingly. Advised to use Lomotil qid prn as prescribed and continue Robinul 2mg  bid. Advised to call Dr. Arlyce Dice or Mike Gip, PA on Monday for further advice.

## 2014-11-29 ENCOUNTER — Other Ambulatory Visit: Payer: Self-pay | Admitting: Obstetrics and Gynecology

## 2014-11-30 LAB — CYTOLOGY - PAP

## 2014-12-21 ENCOUNTER — Telehealth: Payer: Self-pay | Admitting: Gastroenterology

## 2014-12-21 NOTE — Telephone Encounter (Signed)
Having trouble with diarrhea, IBS symptoms today.  Is reluctant to use lomotil more than once per day but I told her that was what it was for, safe to take 1-2 pills up to 4 times per day.  She will call the office in the morning if still having problems.

## 2014-12-22 ENCOUNTER — Telehealth: Payer: Self-pay | Admitting: Gastroenterology

## 2014-12-23 ENCOUNTER — Other Ambulatory Visit: Payer: Self-pay

## 2014-12-23 MED ORDER — PROMETHAZINE HCL 25 MG PO TABS: 25.0000 mg | ORAL_TABLET | Freq: Four times a day (QID) | ORAL | Status: DC | PRN

## 2014-12-23 MED ORDER — DIPHENOXYLATE-ATROPINE 2.5-0.025 MG PO TABS: 1.0000 | ORAL_TABLET | Freq: Four times a day (QID) | ORAL | Status: DC | PRN

## 2014-12-23 NOTE — Telephone Encounter (Signed)
Her father died. She is having abdominal cramps, diarrhea and some nausea. She talked to Dr Christella HartiganJacobs, who had her increase the Lomotil to 8 in 24 hours to get the diarrhea under control. She needs a refill now. Refilled that medication and Phenergan for her.

## 2014-12-26 NOTE — Telephone Encounter (Signed)
Ok that's fine

## 2014-12-29 ENCOUNTER — Telehealth: Payer: Self-pay | Admitting: Gastroenterology

## 2014-12-29 NOTE — Telephone Encounter (Signed)
IBS more active with increased crampy pain and diarrhea for past several days. Current meds not adequately working. Take glycopyrrolate and Lomotil as prescribed. Advised clear liquid diet for now and contact Dr. Arlyce DiceKaplan during regular office hours.

## 2014-12-30 NOTE — Telephone Encounter (Signed)
Spoke with the patient. She had another episode of the abdominal pain. She was in such pain she was afraid to move fearing she would be incontinent of stool. She states the pain is terrible. Her daily living has been greatly affected by this. Agrees to an appointment for further evaluation.

## 2015-01-02 ENCOUNTER — Ambulatory Visit: Payer: Managed Care, Other (non HMO) | Admitting: Physician Assistant

## 2015-01-25 ENCOUNTER — Telehealth: Payer: Self-pay | Admitting: Gastroenterology

## 2015-01-25 NOTE — Telephone Encounter (Signed)
ok 

## 2015-01-25 NOTE — Telephone Encounter (Signed)
I put her records on your desk.

## 2015-01-25 NOTE — Telephone Encounter (Signed)
I am unable to accept transfer

## 2015-01-26 NOTE — Telephone Encounter (Signed)
Dr Russella Dar is unable to accept new patient's. Does the patient want to stay with Dr Arlyce Dice?

## 2015-01-26 NOTE — Telephone Encounter (Signed)
LM on Vmail to CB to see if she wants to sch'd w/Dr. Arlyce Dice

## 2015-05-24 ENCOUNTER — Emergency Department (HOSPITAL_COMMUNITY)
Admission: EM | Admit: 2015-05-24 | Discharge: 2015-05-25 | Disposition: A | Discharge status: Home / Self Care | Payer: 59 | Attending: Emergency Medicine | Admitting: Emergency Medicine

## 2015-05-24 ENCOUNTER — Encounter (HOSPITAL_COMMUNITY): Payer: Self-pay | Admitting: Emergency Medicine

## 2015-05-24 DIAGNOSIS — Z87442 Personal history of urinary calculi: Secondary | ICD-10-CM | POA: Insufficient documentation

## 2015-05-24 DIAGNOSIS — E282 Polycystic ovarian syndrome: Secondary | ICD-10-CM | POA: Insufficient documentation

## 2015-05-24 DIAGNOSIS — F329 Major depressive disorder, single episode, unspecified: Secondary | ICD-10-CM | POA: Insufficient documentation

## 2015-05-24 DIAGNOSIS — F419 Anxiety disorder, unspecified: Secondary | ICD-10-CM | POA: Diagnosis not present

## 2015-05-24 DIAGNOSIS — R410 Disorientation, unspecified: Secondary | ICD-10-CM | POA: Insufficient documentation

## 2015-05-24 DIAGNOSIS — F309 Manic episode, unspecified: Secondary | ICD-10-CM | POA: Diagnosis present

## 2015-05-24 DIAGNOSIS — K219 Gastro-esophageal reflux disease without esophagitis: Secondary | ICD-10-CM | POA: Insufficient documentation

## 2015-05-24 DIAGNOSIS — T701XXA Sinus barotrauma, initial encounter: Secondary | ICD-10-CM | POA: Diagnosis not present

## 2015-05-24 DIAGNOSIS — R0981 Nasal congestion: Secondary | ICD-10-CM | POA: Insufficient documentation

## 2015-05-24 DIAGNOSIS — Z79899 Other long term (current) drug therapy: Secondary | ICD-10-CM | POA: Diagnosis not present

## 2015-05-24 DIAGNOSIS — Z91040 Latex allergy status: Secondary | ICD-10-CM | POA: Diagnosis not present

## 2015-05-24 DIAGNOSIS — R451 Restlessness and agitation: Secondary | ICD-10-CM | POA: Insufficient documentation

## 2015-05-24 DIAGNOSIS — E079 Disorder of thyroid, unspecified: Secondary | ICD-10-CM | POA: Insufficient documentation

## 2015-05-24 DIAGNOSIS — G8929 Other chronic pain: Secondary | ICD-10-CM | POA: Diagnosis not present

## 2015-05-24 LAB — CBC
HCT: 39.3 % (ref 36.0–46.0)
Hemoglobin: 12.3 g/dL (ref 12.0–15.0)
MCH: 29.4 pg (ref 26.0–34.0)
MCHC: 31.3 g/dL (ref 30.0–36.0)
MCV: 93.8 fL (ref 78.0–100.0)
PLATELETS: 294 10*3/uL (ref 150–400)
RBC: 4.19 MIL/uL (ref 3.87–5.11)
RDW: 14.3 % (ref 11.5–15.5)
WBC: 9.7 10*3/uL (ref 4.0–10.5)

## 2015-05-24 LAB — COMPREHENSIVE METABOLIC PANEL
ALT: 16 U/L (ref 14–54)
AST: 17 U/L (ref 15–41)
Albumin: 4.4 g/dL (ref 3.5–5.0)
Alkaline Phosphatase: 74 U/L (ref 38–126)
Anion gap: 12 (ref 5–15)
BUN: 10 mg/dL (ref 6–20)
CO2: 24 mmol/L (ref 22–32)
Calcium: 9.5 mg/dL (ref 8.9–10.3)
Chloride: 104 mmol/L (ref 101–111)
Creatinine, Ser: 0.66 mg/dL (ref 0.44–1.00)
Glucose, Bld: 113 mg/dL — ABNORMAL HIGH (ref 65–99)
POTASSIUM: 3.2 mmol/L — AB (ref 3.5–5.1)
Sodium: 140 mmol/L (ref 135–145)
Total Bilirubin: 0.6 mg/dL (ref 0.3–1.2)
Total Protein: 7.5 g/dL (ref 6.5–8.1)

## 2015-05-24 LAB — SALICYLATE LEVEL

## 2015-05-24 LAB — I-STAT BETA HCG BLOOD, ED (MC, WL, AP ONLY)

## 2015-05-24 LAB — ETHANOL

## 2015-05-24 LAB — ACETAMINOPHEN LEVEL: Acetaminophen (Tylenol), Serum: <10 ug/mL — ABNORMAL LOW (ref 10–30)

## 2015-05-24 MED ORDER — LORAZEPAM 1 MG PO TABS
1.0000 mg | ORAL_TABLET | Freq: Three times a day (TID) | ORAL | Status: DC | PRN
  Administered 2015-05-24: 1 mg via ORAL
  Filled 2015-05-24: qty 1

## 2015-05-24 MED ORDER — ZOLPIDEM TARTRATE 5 MG PO TABS: 5.0000 mg | ORAL_TABLET | Freq: Every evening | ORAL | Status: DC | PRN

## 2015-05-24 MED ORDER — ACETAMINOPHEN 325 MG PO TABS: 650.0000 mg | ORAL_TABLET | ORAL | Status: DC | PRN

## 2015-05-24 MED ORDER — ONDANSETRON HCL 4 MG PO TABS: 4.0000 mg | ORAL_TABLET | Freq: Three times a day (TID) | ORAL | Status: DC | PRN

## 2015-05-24 MED ORDER — KETOROLAC TROMETHAMINE 30 MG/ML IJ SOLN
60.0000 mg | Freq: Once | INTRAMUSCULAR | Status: AC
  Administered 2015-05-24: 60 mg via INTRAMUSCULAR
  Filled 2015-05-24: qty 2

## 2015-05-24 NOTE — ED Provider Notes (Signed)
CSN: [id number]     Arrival date & time 05/24/15  1813 History   First MD Initiated Contact with Patient 05/24/15 2023     Chief Complaint  Patient presents with  . Manic Behavior  . Insomnia     (Consider location/radiation/quality/duration/timing/severity/associated sxs/prior Treatment) HPI Vanessa [name] is a 39 y.o. female with history of migraine headaches, thyroid disease, anxiety, depression, presents to emergency department complaining of abnormal behavior. Most of the history provided by the husband. Patient states that she was unaware that they have changed her anxiety medication dosages, and she has been taking them as prior, and states that she ran out early. She states she ran out of her Xanax approximately 2 weeks ago. She states she also stopped all her other depression medications. Husband reports in the last 3 days, patient has been acting "manic." Patient is talking out of her head, she is obsessed with writing things down, she scribbling on the note pad. Patient is oriented, however "talks out of her head." Husband states that sometimes what she says does not make any sense. Patient was also diagnosed with sinus infection several days ago and vertigo, started Augmentin and meclizine. She states that she is currently having a headache. She denies any fever or chills. No nasal congestion. She she also has history of migraines and unsure of this headache is from a migraine or sinus infection. Husband reports the patient has not slept the last 2 days. Patient is talking to me through her mother.  Past Medical History  Diagnosis Date  . GERD (gastroesophageal reflux disease)   . Anxiety   . Chronic headaches   . Thyroid disease   . History of kidney stones   . PCOS (polycystic ovarian syndrome)     Takes metformin   . Gallstones    Past Surgical History  Procedure Laterality Date  . Cesarean section    . Cholecystectomy    . Cholecystectomy N/A 02/12/2013    Procedure:  LAPAROSCOPIC CHOLECYSTECTOMY WITH INTRAOPERATIVE CHOLANGIOGRAM;  Surgeon: Romie Levee, MD;  Location: [facility];  Service: General;  Laterality: N/A;  . Ercp  02/13/2013    Dr. Madilyn Fireman   Family History  Problem Relation Age of Onset  . Colon cancer Neg Hx   . Crohn's disease Brother   . Irritable bowel syndrome Mother   . Diabetes Father   . Hypertension Mother   . Hypertension Father   . Thyroid cancer Mother   . Cancer Father     sarcoma with mets  . Leukemia Maternal Grandfather    Social History  Substance Use Topics  . Smoking status: Never Smoker   . Smokeless tobacco: Never Used  . Alcohol Use: No   OB History    No data available     Review of Systems  Constitutional: Negative for fever and chills.  HENT: Positive for congestion and sinus pressure.   Respiratory: Negative for cough, chest tightness and shortness of breath.   Cardiovascular: Negative for chest pain, palpitations and leg swelling.  Gastrointestinal: Negative for nausea, vomiting, abdominal pain and diarrhea.  Musculoskeletal: Negative for myalgias, arthralgias, neck pain and neck stiffness.  Neurological: Negative for dizziness, weakness and headaches.  Psychiatric/Behavioral: Positive for confusion and agitation.  All other systems reviewed and are negative.     Allergies  Ciprofloxacin in d5w; Iodine; Betadine; and Latex  Home Medications   Prior to Admission medications   Medication Sig Start Date End Date Taking? Authorizing Provider  amLODipine (NORVASC) 5  MG tablet Take 5 mg by mouth at bedtime.    Yes Historical Provider, MD  cholecalciferol (VITAMIN D) 1000 UNITS tablet Take 1,000 Units by mouth daily.   Yes Historical Provider, MD  diphenoxylate-atropine (LOMOTIL) 2.5-0.025 MG per tablet Take 1 tablet by mouth 4 (four) times daily as needed for diarrhea or loose stools. 12/23/14  Yes Amy S Esterwood, PA-C  glycopyrrolate (ROBINUL) 2 MG tablet Take 1 tablet (2 mg total) by mouth 2 (two)  times daily. 04/28/14  Yes Amy S Esterwood, PA-C  lansoprazole (PREVACID) 30 MG capsule Take 30 mg by mouth 2 (two) times daily before a meal.  02/19/13  Yes Historical Provider, MD  levothyroxine (SYNTHROID, LEVOTHROID) 100 MCG tablet Take 100 mcg by mouth daily before breakfast.   Yes Historical Provider, MD  Prenatal Vit-Fe Fumarate-FA (PRENATAL MULTIVITAMIN) TABS tablet Take 1 tablet by mouth every morning.   Yes Historical Provider, MD  promethazine (PHENERGAN) 25 MG tablet Take 1 tablet (25 mg total) by mouth every 6 (six) hours as needed for nausea or vomiting. 12/23/14  Yes Amy S Esterwood, PA-C  saccharomyces boulardii (FLORASTOR) 250 MG capsule Take 1 tab daily over the next month. 04/28/14  Yes Amy S Esterwood, PA-C  SUMAtriptan-naproxen (TREXIMET) 85-500 MG per tablet Take 1 tablet by mouth every 2 (two) hours as needed for migraine.   Yes Historical Provider, MD  ALPRAZolam Prudy Feeler(XANAX) 0.5 MG tablet Take 1/2 to 1 tab twice daily as needed Patient not taking: Reported on 05/24/2015 04/28/14   Amy S Esterwood, PA-C   BP 169/98 mmHg  Pulse 85  Temp(Src) 98.2 F (36.8 C) (Oral)  Resp 20  SpO2 98% Physical Exam  Constitutional: She appears well-developed and well-nourished. No distress.  HENT:  Head: Normocephalic.  Eyes: Conjunctivae are normal.  Neck: Neck supple.  Cardiovascular: Normal rate, regular rhythm and normal heart sounds.   Pulmonary/Chest: Effort normal and breath sounds normal. No respiratory distress. She has no wheezes. She has no rales.  Abdominal: Soft. Bowel sounds are normal. She exhibits no distension. There is no tenderness. There is no rebound.  Musculoskeletal: She exhibits no edema.  Neurological: She is alert.  Skin: Skin is warm and dry.  Psychiatric: She has a normal mood and affect. Her behavior is normal.  Nursing note and vitals reviewed.   ED Course  Procedures (including critical care time) Labs Review Labs Reviewed  COMPREHENSIVE METABOLIC PANEL -  Abnormal; Notable for the following:    Potassium 3.2 (*)    Glucose, Bld 113 (*)    All other components within normal limits  ACETAMINOPHEN LEVEL - Abnormal; Notable for the following:    Acetaminophen (Tylenol), Serum <10 (*)    All other components within normal limits  ETHANOL  SALICYLATE LEVEL  CBC  URINE RAPID DRUG SCREEN, HOSP PERFORMED  I-STAT BETA HCG BLOOD, ED ([facility], [facility], [facility] ONLY)    Imaging Review No results found. I have personally reviewed and evaluated these images and lab results as part of my medical decision-making.   EKG Interpretation None      MDM   Final diagnoses:  Anxiety    Patient with history of anxiety and depression, here with worsening symptoms. Also according to the family, patient with psychotic features such as "talking out of the head," writing things down, bizarre behavior, not sleeping. Patient has never had this before. Patient's husband states that this is completely unlike her. She recently took herself off of all of her medications. She has  not had Xanax in over a week. Patient denies suicidal or homicidal ideations. Patient is alert and oriented and reasonable with me. She is able to recall correct events and dates.  TTS consulted. Medically cleared.  Assessed by TTS, recommended inpatient admission. Patient however does not want to stay. Patient states she is to go home to her daughter. Again patient is rational with me, however does have outbursts of anger while talking. I discussed patient with her husband outside of the room and with her mother as well. They are comfortable taking her home. They believe that involuntary committing her and keeping her in the hospital despite her we will will make things worse. Husband and mother do not think the patient is harmful to self or anyone else, they're just concerned about her behavior. Husband will take patient home, and they will follow-up with psychiatrist as soon as able. Asked to refill her  Xanax, will give 10 tablets. Instructed to bring her back if any worsening symptoms.  Filed Vitals:   05/24/15 1826 05/24/15 1842 05/25/15 0056  BP:  169/98 144/96  Pulse: 104 85 77  Temp:  98.2 F (36.8 C) 98.3 F (36.8 C)  TempSrc:  Oral Oral  Resp: SpO2: 96% 98% 99%      Jaynie Crumble, PA-C 05/25/15 0211  Linwood Dibbles, MD 05/26/15 1028
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
## 2015-05-24 NOTE — ED Notes (Signed)
UNDER TO COLLECT LABS FROM PATIENT BECAUSE OF HER BEHAVIOR.  I MADE THE NURSE AWARE.

## 2015-05-24 NOTE — ED Notes (Signed)
Pt unwilling to allow for blood pressure and temperature at this time. Pt speaking through her family members stating, " I am have been set free, my chains are broken. Mom speak, you are the only one they can hear."

## 2015-05-24 NOTE — ED Notes (Signed)
Pt was cut in half on her Xanax about 2 weeks ago by her provider after being treated for anxiety for several years.  Pt went to urgent care for sinus infection and vertigo.  Pt started around 4am today that she started getting on the phone trying to call her grandmother and hasnt had any of her medications today.  Pt is very manic.

## 2015-05-25 ENCOUNTER — Ambulatory Visit (HOSPITAL_COMMUNITY)
Admission: RE | Admit: 2015-05-25 | Discharge: 2015-05-25 | Disposition: A | Discharge status: Home / Self Care | Payer: 59 | Attending: Psychiatry | Admitting: Psychiatry

## 2015-05-25 ENCOUNTER — Encounter (HOSPITAL_COMMUNITY): Payer: Self-pay | Admitting: Emergency Medicine

## 2015-05-25 ENCOUNTER — Emergency Department (HOSPITAL_COMMUNITY)
Admission: EM | Admit: 2015-05-25 | Discharge: 2015-05-26 | Disposition: A | Discharge status: Other Acute Inpatient Hospital | Payer: 59 | Source: Home / Self Care | Attending: Emergency Medicine | Admitting: Emergency Medicine

## 2015-05-25 DIAGNOSIS — R462 Strange and inexplicable behavior: Secondary | ICD-10-CM

## 2015-05-25 LAB — RAPID URINE DRUG SCREEN, HOSP PERFORMED
AMPHETAMINES: NOT DETECTED
BARBITURATES: NOT DETECTED
BENZODIAZEPINES: POSITIVE — AB
COCAINE: NOT DETECTED
Opiates: NOT DETECTED
Tetrahydrocannabinol: NOT DETECTED

## 2015-05-25 LAB — PREGNANCY, URINE: Preg Test, Ur: NEGATIVE

## 2015-05-25 MED ORDER — ACETAMINOPHEN 500 MG PO TABS
1000.0000 mg | ORAL_TABLET | Freq: Once | ORAL | Status: AC
  Administered 2015-05-25: 1000 mg via ORAL
  Filled 2015-05-25: qty 2

## 2015-05-25 MED ORDER — LORAZEPAM 2 MG/ML IJ SOLN
1.0000 mg | Freq: Once | INTRAMUSCULAR | Status: AC
  Administered 2015-05-25: 1 mg via INTRAMUSCULAR
  Filled 2015-05-25: qty 1

## 2015-05-25 MED ORDER — ALPRAZOLAM 0.5 MG PO TABS: 0.5000 mg | ORAL_TABLET | Freq: Two times a day (BID) | ORAL | Status: DC

## 2015-05-25 MED ORDER — HALOPERIDOL LACTATE 5 MG/ML IJ SOLN
5.0000 mg | Freq: Once | INTRAMUSCULAR | Status: AC
  Administered 2015-05-25: 5 mg via INTRAMUSCULAR
  Filled 2015-05-25: qty 1

## 2015-05-25 MED ORDER — HALOPERIDOL 2 MG PO TABS
2.0000 mg | ORAL_TABLET | Freq: Two times a day (BID) | ORAL | Status: DC
  Administered 2015-05-25 – 2015-05-26 (×2): 2 mg via ORAL
  Filled 2015-05-25 (×2): qty 1

## 2015-05-25 MED ORDER — LORAZEPAM 1 MG PO TABS
1.0000 mg | ORAL_TABLET | Freq: Three times a day (TID) | ORAL | Status: DC | PRN
  Administered 2015-05-25 – 2015-05-26 (×2): 1 mg via ORAL
  Filled 2015-05-25 (×3): qty 1

## 2015-05-25 MED ORDER — HYDROXYZINE HCL 25 MG PO TABS: 25.0000 mg | ORAL_TABLET | Freq: Three times a day (TID) | ORAL | Status: DC | PRN

## 2015-05-25 MED ORDER — DIPHENHYDRAMINE HCL 50 MG/ML IJ SOLN
12.5000 mg | Freq: Once | INTRAMUSCULAR | Status: AC
  Administered 2015-05-25: 12.5 mg via INTRAMUSCULAR
  Filled 2015-05-25: qty 1

## 2015-05-25 MED ORDER — BENZTROPINE MESYLATE 1 MG PO TABS
0.5000 mg | ORAL_TABLET | Freq: Two times a day (BID) | ORAL | Status: DC
  Administered 2015-05-25 – 2015-05-26 (×2): 0.5 mg via ORAL
  Filled 2015-05-25 (×2): qty 1

## 2015-05-25 NOTE — ED Notes (Signed)
Pt standing in hallway with blank stare talking about Hope.  Pt refuses to go to her room when asked by NP for privacy purposes.  She is oriented to person,place, and time.  When given ativan po she spit it out and continued to say "Hope"  Order obtained from NP and pt. Was escorted to room.  Pt is religiously preoccupied stating " My chain has been set free"  Security here, pt asked to stay in her room.  15 minute checks and video monitoring in place.

## 2015-05-25 NOTE — ED Notes (Signed)
Patient brought IVCed by [facility] for psychosis, IVC papers state patient will not speak in any meaningful way. Pt makes nonsensical statements and does not answer questions directly.

## 2015-05-25 NOTE — BH Assessment (Signed)
[facility] due to actively psychotic behaviors.  Patient was IVC'd by the Dr. Cherrie GauzePsychiatrist  (Dr. Ladona Ridgelaylor).  Patient was not assessed by TTS.  Writer informed the Press photographerCharge Nurse Rushie Goltz(Faith Hilda LiasMarie).

## 2015-05-25 NOTE — ED Notes (Signed)
Pt oriented to room and unit.Her behavior is bizarre.  She is slow to respond and responds inappropriately to questions.  She is not redirectable and continues to pull her shirt up to expose her stomach and refuses to go into her room.  15 minute checks and video monitoring in place.

## 2015-05-25 NOTE — ED Notes (Signed)
Patient's husband Princella IonBob Linares can be reached at [phone].

## 2015-05-25 NOTE — BH Assessment (Signed)
Per [place], [facility]. The accepting provider is Dr. Otho Perlaj Thotakura. Nursing report [phone]. [facility] after 9am 05/26/2015.  [facility].

## 2015-05-25 NOTE — ED Notes (Signed)
[facility], pt recently stopped taking xanax cold [place], pt was given prescription for xanax yesterday, but unsure if pt got prescription filled. [facility] concerned that if pt may be in a delirium from xanax withdrawal. Will made md aware.

## 2015-05-25 NOTE — BH Assessment (Addendum)
[facility] Assessment Progress Note  The following facilities have been contacted to seek placement for this pt, with results as noted:  Beds available, information sent, decision pending:  [facility]   [facility]:  [facility]    Laddie Naeem, [place] Triage Specialist [phone]

## 2015-05-25 NOTE — ED Notes (Signed)
Patient ambulated to bathroom independently. Pt fluctuates between levels of awareness, becomes acutely concerned that she does not understand "the truth" and is unsure whether staff around her are real or not. Memory and attention span deficits present. Patient calm and cooperative but easily distracted, needs constant reorientation.

## 2015-05-25 NOTE — ED Notes (Signed)
Bed: [facility] Expected date:  Expected time:  Means of arrival:  Comments: RM 17

## 2015-05-25 NOTE — ED Provider Notes (Signed)
CSN: [id number]     Arrival date & time 05/25/15  1141 History   First MD Initiated Contact with Patient 05/25/15 1211     Chief Complaint  Patient presents with  . IVC      (Consider location/radiation/quality/duration/timing/severity/associated sxs/prior Treatment) HPI Comments: 39 year old female with past medical history including migraines, PCO S, GERD who presents with abnormal behavior. The patient was brought here yesterday for evaluation of bizarre behavior noted by husband, including mania and nonsensical conversation. At that time she was evaluated by TTS and recommended for inpatient admission, however the patient did not want to be admitted and family was in agreement with discharge with outpatient follow-up. Today, the patient was brought in by PD with IVC papers. Currently, the patient is unable to state why she is here. She complains of a headache.  The history is provided by a relative and the police.    Past Medical History  Diagnosis Date  . GERD (gastroesophageal reflux disease)   . Anxiety   . Chronic headaches   . Thyroid disease   . History of kidney stones   . PCOS (polycystic ovarian syndrome)     Takes metformin   . Gallstones    Past Surgical History  Procedure Laterality Date  . Cesarean section    . Cholecystectomy    . Cholecystectomy N/A 02/12/2013    Procedure: LAPAROSCOPIC CHOLECYSTECTOMY WITH INTRAOPERATIVE CHOLANGIOGRAM;  Surgeon: Romie LeveeAlicia Thomas, MD;  Location: [facility];  Service: General;  Laterality: N/A;  . Ercp  02/13/2013    Dr. Madilyn FiremanHayes   Family History  Problem Relation Age of Onset  . Colon cancer Neg Hx   . Crohn's disease Brother   . Irritable bowel syndrome Mother   . Diabetes Father   . Hypertension Mother   . Hypertension Father   . Thyroid cancer Mother   . Cancer Father     sarcoma with mets  . Leukemia Maternal Grandfather    Social History  Substance Use Topics  . Smoking status: Never Smoker   . Smokeless tobacco: Never  Used  . Alcohol Use: No   OB History    No data available     Review of Systems  10 Systems reviewed and are negative for acute change except as noted in the HPI.   Allergies  Ciprofloxacin in d5w; Iodine; Betadine; and Latex  Home Medications   Prior to Admission medications   Medication Sig Start Date End Date Taking? Authorizing Provider  ALPRAZolam Prudy Feeler(XANAX) 0.5 MG tablet Take 1 tablet (0.5 mg total) by mouth 2 (two) times daily. 05/25/15   Tatyana Kirichenko, PA-C  amLODipine (NORVASC) 5 MG tablet Take 5 mg by mouth at bedtime.     Historical Provider, MD  cholecalciferol (VITAMIN D) 1000 UNITS tablet Take 1,000 Units by mouth daily.    Historical Provider, MD  diphenoxylate-atropine (LOMOTIL) 2.5-0.025 MG per tablet Take 1 tablet by mouth 4 (four) times daily as needed for diarrhea or loose stools. 12/23/14   Amy S Esterwood, PA-C  glycopyrrolate (ROBINUL) 2 MG tablet Take 1 tablet (2 mg total) by mouth 2 (two) times daily. 04/28/14   Amy S Esterwood, PA-C  lansoprazole (PREVACID) 30 MG capsule Take 30 mg by mouth 2 (two) times daily before a meal.  02/19/13   Historical Provider, MD  levothyroxine (SYNTHROID, LEVOTHROID) 100 MCG tablet Take 100 mcg by mouth daily before breakfast.    Historical Provider, MD  Prenatal Vit-Fe Fumarate-FA (PRENATAL MULTIVITAMIN) TABS tablet Take 1 tablet  by mouth every morning.    Historical Provider, MD  promethazine (PHENERGAN) 25 MG tablet Take 1 tablet (25 mg total) by mouth every 6 (six) hours as needed for nausea or vomiting. 12/23/14   Amy S Esterwood, PA-C  saccharomyces boulardii (FLORASTOR) 250 MG capsule Take 1 tab daily over the next month. 04/28/14   Amy S Esterwood, PA-C  SUMAtriptan-naproxen (TREXIMET) 85-500 MG per tablet Take 1 tablet by mouth every 2 (two) hours as needed for migraine.    Historical Provider, MD   BP 148/79 mmHg  Pulse 81  Temp(Src) 98.4 F (36.9 C) (Axillary)  Resp 16  SpO2 94% Physical Exam  Constitutional: She  appears well-developed and well-nourished. No distress.  disheveled  HENT:  Head: Normocephalic and atraumatic.  Eyes: Conjunctivae are normal. Pupils are equal, round, and reactive to light.  Pupils dilated  Neck: Neck supple.  Cardiovascular: Normal rate, regular rhythm and normal heart sounds.   No murmur heard. Pulmonary/Chest: Effort normal and breath sounds normal.  Abdominal: Soft. Bowel sounds are normal. She exhibits no distension. There is no tenderness.  Musculoskeletal: She exhibits no edema.  Neurological:  Alert, oriented to person  Skin: Skin is warm and dry.  Psychiatric:  Flat affect, avoids eye contact, only occasionally answers questions with very brief statements; requested her OB/GYN doctor  Nursing note and vitals reviewed.   ED Course  Procedures (including critical care time) Labs Review Labs Reviewed  URINE RAPID DRUG SCREEN, HOSP PERFORMED - Abnormal; Notable for the following:    Benzodiazepines POSITIVE (*)    All other components within normal limits  PREGNANCY, URINE     Medications  acetaminophen (TYLENOL) tablet 1,000 mg (1,000 mg Oral Given 05/25/15 1352)    MDM   Final diagnoses:  Bizarre behavior   patient brought in for evaluation with IVC paperwork; she was seen yesterday for bizarre behavior. On exam, the patient is unable to provide details, avoids eye contact, and occasionally answers with bizarre statements. I reviewed her lab work from yesterday which was unremarkable. UDS here is positive for benzos, making benzo withdrawal less likely as patient was given Rx for Ativan yesterday.  I have consulted TTS for evaluation. Given that they recommended inpatient admission yesterday, I anticipate that the patient will be admitted for psychiatric care.  Laurence Spates, MD 05/25/15 [phone]

## 2015-05-25 NOTE — Discharge Instructions (Signed)
Continue xanax as prescribed for anxiety. Please follow up with a psychiatrist from list below. Please return if worsening.   Generalized Anxiety Disorder Generalized anxiety disorder (GAD) is a mental disorder. It interferes with life functions, including relationships, work, and school. GAD is different from normal anxiety, which everyone experiences at some point in their lives in response to specific life events and activities. Normal anxiety actually helps us prepare for and get through these life events and activities. Normal anxiety goes away after the event or activity is over.  GAD causes anxiety that is not necessarily related to specific events or activities. It also causes excess anxiety in proportion to specific events or activities. The anxiety associated with GAD is also difficult to control. GAD can vary from mild to severe. People with severe GAD can have intense waves of anxiety with physical symptoms (panic attacks).  SYMPTOMS The anxiety and worry associated with GAD are difficult to control. This anxiety and worry are related to many life events and activities and also occur more days than not for 6 months or longer. People with GAD also have three or more of the following symptoms (one or more in children):  Restlessness.   Fatigue.  Difficulty concentrating.   Irritability.  Muscle tension.  Difficulty sleeping or unsatisfying sleep. DIAGNOSIS GAD is diagnosed through an assessment by your health care provider. Your health care provider will ask you questions aboutyour mood,physical symptoms, and events in your life. Your health care provider may ask you about your medical history and use of alcohol or drugs, including prescription medicines. Your health care provider may also do a physical exam and blood tests. Certain medical conditions and the use of certain substances can cause symptoms similar to those associated with GAD. Your health care provider may refer you  to a mental health specialist for further evaluation. TREATMENT The following therapies are usually used to treat GAD:   Medication. Antidepressant medication usually is prescribed for long-term daily control. Antianxiety medicines may be added in severe cases, especially when panic attacks occur.   Talk therapy (psychotherapy). Certain types of talk therapy can be helpful in treating GAD by providing support, education, and guidance. A form of talk therapy called cognitive behavioral therapy can teach you healthy ways to think about and react to daily life events and activities.  Stress managementtechniques. These include yoga, meditation, and exercise and can be very helpful when they are practiced regularly. A mental health specialist can help determine which treatment is best for you. Some people see improvement with one therapy. However, other people require a combination of therapies.   This information is not intended to replace advice given to you by your health care provider. Make sure you discuss any questions you have with your health care provider.   Document Released: 09/28/2012 Document Revised: 06/24/2014 Document Reviewed: 09/28/2012 Elsevier Interactive Patient Education [employer]2016 Elsevier Inc.

## 2015-05-26 MED ORDER — HALOPERIDOL 2 MG PO TABS: 2.0000 mg | ORAL_TABLET | Freq: Two times a day (BID) | ORAL | Status: DC

## 2015-05-26 MED ORDER — BENZTROPINE MESYLATE 0.5 MG PO TABS: 0.5000 mg | ORAL_TABLET | Freq: Two times a day (BID) | ORAL | Status: DC

## 2015-05-26 MED ORDER — HYDROXYZINE HCL 25 MG PO TABS: 25.0000 mg | ORAL_TABLET | Freq: Three times a day (TID) | ORAL | Status: DC | PRN

## 2015-05-26 NOTE — ED Notes (Signed)
Patient picked up by sheriff for further [facility]. Behavior appropriate at this time.

## 2015-05-26 NOTE — ED Notes (Signed)
Patient talking to self when first approaching. Smiled and was very polite when introducing self. She had some noticeable thought blocking when speaking with her. Reports feeling better since she got some rest. Making bizarre statements and walking around with abdomen showing but covered up when asked to. Cooperative with taking medications this am and sleeping on and off. Will continue to monitor.

## 2015-05-26 NOTE — ED Notes (Signed)
Went in room to get patient vitals and patient would not even open her eyes to talk or respond to writer in any type of way.

## 2015-05-26 NOTE — ED Notes (Signed)
Patient denies SI, HI and AVH at this time. Patient acts bizarre at this time. Encouragement and support provided and safety maintain. Q 15 min safety checks remain in place.

## 2017-11-24 DIAGNOSIS — J209 Acute bronchitis, unspecified: Secondary | ICD-10-CM | POA: Diagnosis not present

## 2017-11-24 DIAGNOSIS — Z6841 Body Mass Index (BMI) 40.0 and over, adult: Secondary | ICD-10-CM | POA: Diagnosis not present

## 2018-03-09 DIAGNOSIS — L03115 Cellulitis of right lower limb: Secondary | ICD-10-CM | POA: Diagnosis not present

## 2018-03-09 DIAGNOSIS — Z23 Encounter for immunization: Secondary | ICD-10-CM | POA: Diagnosis not present

## 2018-03-09 DIAGNOSIS — I872 Venous insufficiency (chronic) (peripheral): Secondary | ICD-10-CM | POA: Diagnosis not present

## 2018-03-09 DIAGNOSIS — E039 Hypothyroidism, unspecified: Secondary | ICD-10-CM | POA: Diagnosis not present

## 2018-03-09 DIAGNOSIS — E282 Polycystic ovarian syndrome: Secondary | ICD-10-CM | POA: Diagnosis not present

## 2018-03-19 DIAGNOSIS — E282 Polycystic ovarian syndrome: Secondary | ICD-10-CM | POA: Diagnosis not present

## 2018-03-19 DIAGNOSIS — L03115 Cellulitis of right lower limb: Secondary | ICD-10-CM | POA: Diagnosis not present

## 2018-03-25 DIAGNOSIS — E119 Type 2 diabetes mellitus without complications: Secondary | ICD-10-CM | POA: Diagnosis not present

## 2018-04-23 DIAGNOSIS — N913 Primary oligomenorrhea: Secondary | ICD-10-CM | POA: Diagnosis not present

## 2018-04-23 DIAGNOSIS — E039 Hypothyroidism, unspecified: Secondary | ICD-10-CM | POA: Diagnosis not present

## 2018-04-23 DIAGNOSIS — R635 Abnormal weight gain: Secondary | ICD-10-CM | POA: Diagnosis not present

## 2018-05-05 ENCOUNTER — Ambulatory Visit: Payer: BLUE CROSS/BLUE SHIELD | Admitting: Podiatry

## 2018-05-05 ENCOUNTER — Other Ambulatory Visit: Payer: Self-pay | Admitting: Podiatry

## 2018-05-05 ENCOUNTER — Encounter: Payer: Self-pay | Admitting: Podiatry

## 2018-05-05 ENCOUNTER — Ambulatory Visit: Payer: BLUE CROSS/BLUE SHIELD

## 2018-05-05 ENCOUNTER — Ambulatory Visit (INDEPENDENT_AMBULATORY_CARE_PROVIDER_SITE_OTHER): Payer: BLUE CROSS/BLUE SHIELD

## 2018-05-05 DIAGNOSIS — M79671 Pain in right foot: Secondary | ICD-10-CM

## 2018-05-05 DIAGNOSIS — M722 Plantar fascial fibromatosis: Secondary | ICD-10-CM

## 2018-05-05 MED ORDER — MELOXICAM 15 MG PO TABS: 15.0000 mg | ORAL_TABLET | Freq: Every day | ORAL | 1 refills | Status: DC

## 2018-05-05 MED ORDER — METHYLPREDNISOLONE 8 MG PO TABS: ORAL_TABLET | ORAL | 0 refills | Status: AC

## 2018-05-07 NOTE — Progress Notes (Signed)
   Subjective: 42 year old female presenting today as a new patient with a chief complaint of intermittent sharp, stabbing right heel pain that began about one year ago. She states she was carrying groceries when she stepped down on the foot wrong. She states the pain is sometimes severe and almost unbearable, especially when she is on her feet for long periods of time. She has not had any recent treatment. Patient is here for further evaluation and treatment.   Past Medical History:  Diagnosis Date  . Anxiety   . Chronic headaches   . Gallstones   . GERD (gastroesophageal reflux disease)   . History of kidney stones   . PCOS (polycystic ovarian syndrome)    Takes metformin   . Thyroid disease      Objective: Physical Exam General: The patient is alert and oriented x3 in no acute distress.  Dermatology: Skin is warm, dry and supple bilateral lower extremities. Negative for open lesions or macerations bilateral.   Vascular: Dorsalis Pedis and Posterior Tibial pulses palpable bilateral.  Capillary fill time is immediate to all digits.  Neurological: Epicritic and protective threshold intact bilateral.   Musculoskeletal: Tenderness to palpation to the plantar aspect of the right heel along the plantar fascia. All other joints range of motion within normal limits bilateral. Strength 5/5 in all groups bilateral.   Radiographic exam: Normal osseous mineralization. Joint spaces preserved. No fracture/dislocation/boney destruction. No other soft tissue abnormalities or radiopaque foreign bodies.   Assessment: 1. Plantar fasciitis right  Plan of Care:  1. Patient evaluated. Xrays reviewed.   2. Injection of 0.5cc Celestone soluspan injected into the right plantar fascia  3. Rx for Medrol Dose Pack placed 4. Rx for Meloxicam ordered for patient. 4. CAM boot dispensed. Weightbearing as tolerated for four weeks.  5. Instructed patient regarding therapies and modalities at home to  alleviate symptoms.  6. Return to clinic in 4 weeks.     Felecia ShellingBrent M. , DPM [facility]  Dr. Felecia ShellingBrent M. , DPM    [address]                                        [place], [place] [address]                Office [phone]  Fax [phone]

## 2018-05-27 DIAGNOSIS — D485 Neoplasm of uncertain behavior of skin: Secondary | ICD-10-CM | POA: Diagnosis not present

## 2018-05-27 DIAGNOSIS — L919 Hypertrophic disorder of the skin, unspecified: Secondary | ICD-10-CM | POA: Diagnosis not present

## 2018-06-05 ENCOUNTER — Encounter: Payer: BLUE CROSS/BLUE SHIELD | Admitting: Podiatry

## 2018-06-13 NOTE — Progress Notes (Signed)
This encounter was created in error - please disregard.

## 2018-06-26 DIAGNOSIS — E282 Polycystic ovarian syndrome: Secondary | ICD-10-CM | POA: Diagnosis not present

## 2018-06-26 DIAGNOSIS — E119 Type 2 diabetes mellitus without complications: Secondary | ICD-10-CM | POA: Diagnosis not present

## 2018-06-26 DIAGNOSIS — E039 Hypothyroidism, unspecified: Secondary | ICD-10-CM | POA: Diagnosis not present

## 2018-06-26 DIAGNOSIS — G43009 Migraine without aura, not intractable, without status migrainosus: Secondary | ICD-10-CM | POA: Diagnosis not present

## 2018-07-01 DIAGNOSIS — E039 Hypothyroidism, unspecified: Secondary | ICD-10-CM | POA: Diagnosis not present

## 2018-07-03 ENCOUNTER — Encounter: Payer: BLUE CROSS/BLUE SHIELD | Admitting: Podiatry

## 2018-07-07 NOTE — Progress Notes (Signed)
This encounter was created in error - please disregard.

## 2018-07-14 DIAGNOSIS — M170 Bilateral primary osteoarthritis of knee: Secondary | ICD-10-CM | POA: Diagnosis not present

## 2018-08-25 ENCOUNTER — Other Ambulatory Visit: Payer: Self-pay | Admitting: Podiatry

## 2018-09-29 ENCOUNTER — Other Ambulatory Visit: Payer: Self-pay

## 2018-09-29 MED ORDER — MELOXICAM 15 MG PO TABS: 15.0000 mg | ORAL_TABLET | Freq: Every day | ORAL | 3 refills | Status: DC

## 2018-09-29 NOTE — Telephone Encounter (Signed)
Pharmacy refill request for Meloxicam 15mg .  Per Dr. Logan Bores verbal order, ok to refill medication.   Script has been sent to pharmacy.

## 2018-10-02 DIAGNOSIS — E119 Type 2 diabetes mellitus without complications: Secondary | ICD-10-CM | POA: Diagnosis not present

## 2018-10-02 DIAGNOSIS — R6889 Other general symptoms and signs: Secondary | ICD-10-CM | POA: Diagnosis not present

## 2018-10-13 DIAGNOSIS — I8311 Varicose veins of right lower extremity with inflammation: Secondary | ICD-10-CM | POA: Diagnosis not present

## 2018-10-13 DIAGNOSIS — L308 Other specified dermatitis: Secondary | ICD-10-CM | POA: Diagnosis not present

## 2018-10-13 DIAGNOSIS — L309 Dermatitis, unspecified: Secondary | ICD-10-CM | POA: Diagnosis not present

## 2018-10-13 DIAGNOSIS — I872 Venous insufficiency (chronic) (peripheral): Secondary | ICD-10-CM | POA: Diagnosis not present

## 2018-10-13 DIAGNOSIS — I8312 Varicose veins of left lower extremity with inflammation: Secondary | ICD-10-CM | POA: Diagnosis not present

## 2018-12-04 DIAGNOSIS — R21 Rash and other nonspecific skin eruption: Secondary | ICD-10-CM | POA: Diagnosis not present

## 2018-12-10 DIAGNOSIS — L03115 Cellulitis of right lower limb: Secondary | ICD-10-CM | POA: Diagnosis not present

## 2018-12-10 DIAGNOSIS — R21 Rash and other nonspecific skin eruption: Secondary | ICD-10-CM | POA: Diagnosis not present

## 2018-12-29 DIAGNOSIS — E119 Type 2 diabetes mellitus without complications: Secondary | ICD-10-CM | POA: Diagnosis not present

## 2018-12-29 DIAGNOSIS — G43009 Migraine without aura, not intractable, without status migrainosus: Secondary | ICD-10-CM | POA: Diagnosis not present

## 2018-12-29 DIAGNOSIS — E282 Polycystic ovarian syndrome: Secondary | ICD-10-CM | POA: Diagnosis not present

## 2018-12-29 DIAGNOSIS — F419 Anxiety disorder, unspecified: Secondary | ICD-10-CM | POA: Diagnosis not present

## 2019-01-12 DIAGNOSIS — I8312 Varicose veins of left lower extremity with inflammation: Secondary | ICD-10-CM | POA: Diagnosis not present

## 2019-01-12 DIAGNOSIS — I8311 Varicose veins of right lower extremity with inflammation: Secondary | ICD-10-CM | POA: Diagnosis not present

## 2019-01-12 DIAGNOSIS — I872 Venous insufficiency (chronic) (peripheral): Secondary | ICD-10-CM | POA: Diagnosis not present

## 2019-02-25 ENCOUNTER — Other Ambulatory Visit: Payer: Self-pay | Admitting: Podiatry

## 2019-06-10 DIAGNOSIS — Z20828 Contact with and (suspected) exposure to other viral communicable diseases: Secondary | ICD-10-CM | POA: Diagnosis not present

## 2019-09-09 ENCOUNTER — Other Ambulatory Visit: Payer: Self-pay | Admitting: Podiatry

## 2019-09-25 ENCOUNTER — Other Ambulatory Visit: Payer: Self-pay | Admitting: Podiatry

## 2020-05-18 ENCOUNTER — Encounter: Payer: Self-pay | Admitting: Physician Assistant

## 2020-06-06 ENCOUNTER — Ambulatory Visit: Payer: Managed Care, Other (non HMO) | Admitting: Physician Assistant

## 2020-07-14 ENCOUNTER — Ambulatory Visit: Payer: Managed Care, Other (non HMO) | Admitting: Physician Assistant

## 2020-07-27 ENCOUNTER — Ambulatory Visit: Payer: Managed Care, Other (non HMO) | Admitting: Physician Assistant

## 2020-08-16 ENCOUNTER — Ambulatory Visit: Payer: Managed Care, Other (non HMO) | Admitting: Physician Assistant

## 2020-08-30 ENCOUNTER — Ambulatory Visit: Payer: Managed Care, Other (non HMO) | Admitting: Physician Assistant

## 2020-09-14 ENCOUNTER — Ambulatory Visit: Payer: Managed Care, Other (non HMO) | Admitting: Physician Assistant

## 2020-10-05 ENCOUNTER — Ambulatory Visit: Payer: Managed Care, Other (non HMO) | Admitting: Physician Assistant

## 2021-08-29 DIAGNOSIS — E119 Type 2 diabetes mellitus without complications: Secondary | ICD-10-CM | POA: Diagnosis not present

## 2021-08-29 DIAGNOSIS — Z6841 Body Mass Index (BMI) 40.0 and over, adult: Secondary | ICD-10-CM | POA: Diagnosis not present

## 2021-08-29 DIAGNOSIS — K589 Irritable bowel syndrome without diarrhea: Secondary | ICD-10-CM | POA: Diagnosis not present

## 2021-08-29 DIAGNOSIS — G43009 Migraine without aura, not intractable, without status migrainosus: Secondary | ICD-10-CM | POA: Diagnosis not present

## 2021-08-29 DIAGNOSIS — K219 Gastro-esophageal reflux disease without esophagitis: Secondary | ICD-10-CM | POA: Diagnosis not present

## 2021-08-29 DIAGNOSIS — E039 Hypothyroidism, unspecified: Secondary | ICD-10-CM | POA: Diagnosis not present

## 2021-09-04 DIAGNOSIS — R69 Illness, unspecified: Secondary | ICD-10-CM | POA: Diagnosis not present

## 2021-11-10 DIAGNOSIS — R197 Diarrhea, unspecified: Secondary | ICD-10-CM | POA: Diagnosis not present

## 2021-11-10 DIAGNOSIS — I10 Essential (primary) hypertension: Secondary | ICD-10-CM | POA: Diagnosis not present

## 2021-11-10 DIAGNOSIS — R42 Dizziness and giddiness: Secondary | ICD-10-CM | POA: Diagnosis not present

## 2021-11-10 DIAGNOSIS — W19XXXA Unspecified fall, initial encounter: Secondary | ICD-10-CM | POA: Diagnosis not present

## 2021-11-10 DIAGNOSIS — R110 Nausea: Secondary | ICD-10-CM | POA: Diagnosis not present

## 2021-11-12 DIAGNOSIS — G479 Sleep disorder, unspecified: Secondary | ICD-10-CM | POA: Diagnosis not present

## 2021-11-12 DIAGNOSIS — Z794 Long term (current) use of insulin: Secondary | ICD-10-CM | POA: Diagnosis not present

## 2021-11-12 DIAGNOSIS — Z6841 Body Mass Index (BMI) 40.0 and over, adult: Secondary | ICD-10-CM | POA: Diagnosis not present

## 2021-11-12 DIAGNOSIS — N179 Acute kidney failure, unspecified: Secondary | ICD-10-CM | POA: Diagnosis not present

## 2021-11-12 DIAGNOSIS — J9621 Acute and chronic respiratory failure with hypoxia: Secondary | ICD-10-CM | POA: Diagnosis not present

## 2021-11-12 DIAGNOSIS — F05 Delirium due to known physiological condition: Secondary | ICD-10-CM | POA: Diagnosis not present

## 2021-11-12 DIAGNOSIS — G43009 Migraine without aura, not intractable, without status migrainosus: Secondary | ICD-10-CM | POA: Diagnosis not present

## 2021-11-12 DIAGNOSIS — J9622 Acute and chronic respiratory failure with hypercapnia: Secondary | ICD-10-CM | POA: Diagnosis not present

## 2021-11-12 DIAGNOSIS — G8929 Other chronic pain: Secondary | ICD-10-CM | POA: Diagnosis not present

## 2021-11-12 DIAGNOSIS — E662 Morbid (severe) obesity with alveolar hypoventilation: Secondary | ICD-10-CM | POA: Diagnosis not present

## 2021-11-12 DIAGNOSIS — K2100 Gastro-esophageal reflux disease with esophagitis, without bleeding: Secondary | ICD-10-CM | POA: Diagnosis not present

## 2021-11-12 DIAGNOSIS — K219 Gastro-esophageal reflux disease without esophagitis: Secondary | ICD-10-CM | POA: Diagnosis not present

## 2021-11-12 DIAGNOSIS — F419 Anxiety disorder, unspecified: Secondary | ICD-10-CM | POA: Diagnosis not present

## 2021-11-12 DIAGNOSIS — L304 Erythema intertrigo: Secondary | ICD-10-CM | POA: Diagnosis not present

## 2021-11-12 DIAGNOSIS — R5381 Other malaise: Secondary | ICD-10-CM | POA: Diagnosis not present

## 2021-11-12 DIAGNOSIS — R0689 Other abnormalities of breathing: Secondary | ICD-10-CM | POA: Diagnosis not present

## 2021-11-12 DIAGNOSIS — J9600 Acute respiratory failure, unspecified whether with hypoxia or hypercapnia: Secondary | ICD-10-CM | POA: Diagnosis not present

## 2021-11-12 DIAGNOSIS — G9341 Metabolic encephalopathy: Secondary | ICD-10-CM | POA: Diagnosis not present

## 2021-11-12 DIAGNOSIS — I10 Essential (primary) hypertension: Secondary | ICD-10-CM | POA: Diagnosis not present

## 2021-11-12 DIAGNOSIS — G43909 Migraine, unspecified, not intractable, without status migrainosus: Secondary | ICD-10-CM | POA: Diagnosis not present

## 2021-11-12 DIAGNOSIS — Z9989 Dependence on other enabling machines and devices: Secondary | ICD-10-CM | POA: Diagnosis not present

## 2021-11-12 DIAGNOSIS — G894 Chronic pain syndrome: Secondary | ICD-10-CM | POA: Diagnosis not present

## 2021-11-12 DIAGNOSIS — E119 Type 2 diabetes mellitus without complications: Secondary | ICD-10-CM | POA: Diagnosis not present

## 2021-11-12 DIAGNOSIS — E8729 Other acidosis: Secondary | ICD-10-CM | POA: Diagnosis not present

## 2021-11-12 DIAGNOSIS — R69 Illness, unspecified: Secondary | ICD-10-CM | POA: Diagnosis not present

## 2021-11-12 DIAGNOSIS — I272 Pulmonary hypertension, unspecified: Secondary | ICD-10-CM | POA: Diagnosis not present

## 2021-11-12 DIAGNOSIS — E039 Hypothyroidism, unspecified: Secondary | ICD-10-CM | POA: Diagnosis not present

## 2021-11-12 DIAGNOSIS — E0590 Thyrotoxicosis, unspecified without thyrotoxic crisis or storm: Secondary | ICD-10-CM | POA: Diagnosis not present

## 2021-11-12 DIAGNOSIS — R0602 Shortness of breath: Secondary | ICD-10-CM | POA: Diagnosis not present

## 2021-11-12 DIAGNOSIS — Z7409 Other reduced mobility: Secondary | ICD-10-CM | POA: Diagnosis not present

## 2021-11-12 DIAGNOSIS — R0902 Hypoxemia: Secondary | ICD-10-CM | POA: Diagnosis not present

## 2021-11-12 DIAGNOSIS — E11620 Type 2 diabetes mellitus with diabetic dermatitis: Secondary | ICD-10-CM | POA: Diagnosis not present

## 2021-11-12 DIAGNOSIS — J90 Pleural effusion, not elsewhere classified: Secondary | ICD-10-CM | POA: Diagnosis not present

## 2021-11-12 DIAGNOSIS — J9811 Atelectasis: Secondary | ICD-10-CM | POA: Diagnosis not present

## 2021-11-12 DIAGNOSIS — E872 Acidosis, unspecified: Secondary | ICD-10-CM | POA: Diagnosis not present

## 2021-11-12 DIAGNOSIS — E8770 Fluid overload, unspecified: Secondary | ICD-10-CM | POA: Diagnosis not present

## 2021-11-12 DIAGNOSIS — R410 Disorientation, unspecified: Secondary | ICD-10-CM | POA: Diagnosis not present

## 2021-11-12 DIAGNOSIS — R440 Auditory hallucinations: Secondary | ICD-10-CM | POA: Diagnosis not present

## 2021-11-12 DIAGNOSIS — I2723 Pulmonary hypertension due to lung diseases and hypoxia: Secondary | ICD-10-CM | POA: Diagnosis not present

## 2021-11-12 DIAGNOSIS — R451 Restlessness and agitation: Secondary | ICD-10-CM | POA: Diagnosis not present

## 2021-11-30 DIAGNOSIS — E8770 Fluid overload, unspecified: Secondary | ICD-10-CM | POA: Diagnosis not present

## 2021-11-30 DIAGNOSIS — R5381 Other malaise: Secondary | ICD-10-CM | POA: Diagnosis not present

## 2021-11-30 DIAGNOSIS — J9621 Acute and chronic respiratory failure with hypoxia: Secondary | ICD-10-CM | POA: Diagnosis not present

## 2021-11-30 DIAGNOSIS — E662 Morbid (severe) obesity with alveolar hypoventilation: Secondary | ICD-10-CM | POA: Diagnosis not present

## 2021-11-30 DIAGNOSIS — G4733 Obstructive sleep apnea (adult) (pediatric): Secondary | ICD-10-CM | POA: Diagnosis not present

## 2021-11-30 DIAGNOSIS — I10 Essential (primary) hypertension: Secondary | ICD-10-CM | POA: Diagnosis not present

## 2021-11-30 DIAGNOSIS — Z6841 Body Mass Index (BMI) 40.0 and over, adult: Secondary | ICD-10-CM | POA: Diagnosis not present

## 2021-11-30 DIAGNOSIS — J9622 Acute and chronic respiratory failure with hypercapnia: Secondary | ICD-10-CM | POA: Diagnosis not present

## 2021-12-04 DIAGNOSIS — F339 Major depressive disorder, recurrent, unspecified: Secondary | ICD-10-CM | POA: Diagnosis not present

## 2021-12-04 DIAGNOSIS — R69 Illness, unspecified: Secondary | ICD-10-CM | POA: Diagnosis not present

## 2021-12-17 DIAGNOSIS — J9622 Acute and chronic respiratory failure with hypercapnia: Secondary | ICD-10-CM | POA: Diagnosis not present

## 2021-12-17 DIAGNOSIS — I272 Pulmonary hypertension, unspecified: Secondary | ICD-10-CM | POA: Diagnosis not present

## 2021-12-17 DIAGNOSIS — R69 Illness, unspecified: Secondary | ICD-10-CM | POA: Diagnosis not present

## 2021-12-17 DIAGNOSIS — K219 Gastro-esophageal reflux disease without esophagitis: Secondary | ICD-10-CM | POA: Diagnosis not present

## 2021-12-17 DIAGNOSIS — I10 Essential (primary) hypertension: Secondary | ICD-10-CM | POA: Diagnosis not present

## 2021-12-17 DIAGNOSIS — N179 Acute kidney failure, unspecified: Secondary | ICD-10-CM | POA: Diagnosis not present

## 2021-12-17 DIAGNOSIS — J9621 Acute and chronic respiratory failure with hypoxia: Secondary | ICD-10-CM | POA: Diagnosis not present

## 2021-12-17 DIAGNOSIS — E662 Morbid (severe) obesity with alveolar hypoventilation: Secondary | ICD-10-CM | POA: Diagnosis not present

## 2022-01-08 NOTE — Progress Notes (Deleted)
Synopsis: Referred for chronic hypoxic hypercapnic respiratory failure by Vanessa Peak, PA-C  Subjective:   PATIENT ID: Vanessa [name] GENDER: female DOB: 1975-12-16, MRN: [id number]  No chief complaint on file.  46yF with history of GERD, hypothyroid, PCOS, suspected OHS/OSA, obesity, polypharmacy referred for chronic hypoxic hypercapnic respiratory failure  After last hospitalization, NIV strongly encouraged, qualified for trilogy but not BiPAP however trilogy was too expensive for them per husband. Discharged on home O2.   Otherwise pertinent review of systems is negative.  Past Medical History:  Diagnosis Date   Anxiety    Chronic headaches    Gallstones    GERD (gastroesophageal reflux disease)    History of kidney stones    PCOS (polycystic ovarian syndrome)    Takes metformin    Thyroid disease      Family History  Problem Relation Age of Onset   Crohn's disease Brother    Irritable bowel syndrome Mother    Hypertension Mother    Thyroid cancer Mother    Diabetes Father    Hypertension Father    Cancer Father        sarcoma with mets   Leukemia Maternal Grandfather    Colon cancer Neg Hx      Past Surgical History:  Procedure Laterality Date   CESAREAN SECTION     CHOLECYSTECTOMY     CHOLECYSTECTOMY N/A 02/12/2013   Procedure: LAPAROSCOPIC CHOLECYSTECTOMY WITH INTRAOPERATIVE CHOLANGIOGRAM;  Surgeon: Romie Levee, MD;  Location: [facility];  Service: General;  Laterality: N/A;   ERCP  02/13/2013   Dr. Madilyn Fireman    Social History   Socioeconomic History   Marital status: Married    Spouse name: Not on file   Number of children: 1   Years of education: Not on file   Highest education level: Not on file  Occupational History   Occupation: Homemaker  Tobacco Use   Smoking status: Never   Smokeless tobacco: Never  Substance and Sexual Activity   Alcohol use: No   Drug use: No   Sexual activity: Never  Other Topics Concern   Not on file  Social History  Narrative   Sodas rare    Social Determinants of Health   Financial Resource Strain: Not on file  Food Insecurity: Not on file  Transportation Needs: Not on file  Physical Activity: Not on file  Stress: Not on file  Social Connections: Not on file  Intimate Partner Violence: Not on file     Allergies  Allergen Reactions   Ciprofloxacin In D5w Rash    Pt states developed rash on Lt arm when IV Cipro infusing   Iodine    Betadine [Povidone Iodine] Hives, Itching and Rash   Latex Hives, Itching and Rash     Outpatient Medications Prior to Visit  Medication Sig Dispense Refill   ALPRAZolam (XANAX XR) 2 MG 24 hr tablet Take 2 mg by mouth 2 (two) times daily.  3   amitriptyline (ELAVIL) 25 MG tablet Take 25 mg by mouth daily.  3   benztropine (COGENTIN) 0.5 MG tablet Take 1 tablet (0.5 mg total) by mouth 2 (two) times daily. (Patient not taking: Reported on 05/05/2018)     escitalopram (LEXAPRO) 5 MG tablet Take by mouth.     estradiol (VIVELLE-DOT) 0.1 MG/24HR patch Place one patch to skin, change patch every 3 days     furosemide (LASIX) 40 MG tablet TAKE 1.5 TABLETS BY MOUTH EVERY DAY  3   haloperidol (HALDOL) 2  MG tablet Take 1 tablet (2 mg total) by mouth 2 (two) times daily. (Patient not taking: Reported on 05/05/2018)     hydrOXYzine (ATARAX/VISTARIL) 25 MG tablet Take 1 tablet (25 mg total) by mouth 3 (three) times daily as needed for anxiety. (Patient not taking: Reported on 05/05/2018) 30 tablet 0   lansoprazole (PREVACID) 30 MG capsule Take 30 mg by mouth 2 (two) times daily.  0   levothyroxine (SYNTHROID, LEVOTHROID) 175 MCG tablet Take 175 mcg by mouth daily.  3   medroxyPROGESTERone (PROVERA) 10 MG tablet Take 20 mg by mouth daily.  3   MELATONIN-PYRIDOXINE PO Take by mouth.     meloxicam (MOBIC) 15 MG tablet TAKE 1 TABLET BY MOUTH EVERY DAY 30 tablet 3   metFORMIN (GLUCOPHAGE-XR) 750 MG 24 hr tablet Take 750 mg by mouth daily.  3   progesterone (PROMETRIUM) 200 MG  capsule Insert one tablet vaginally twice daily     propranolol ER (INDERAL LA) 80 MG 24 hr capsule TAKE 1 CAPSULE BY MOUTH TWICE DAILY FOR MIGRAINE PREVENTION  5   sulfamethoxazole-trimethoprim (BACTRIM DS,SEPTRA DS) 800-160 MG tablet Take 1 tablet by mouth 2 (two) times daily.  0   Vitamin D, Ergocalciferol, (DRISDOL) 1.25 MG (50000 UT) CAPS capsule Take by mouth.     No facility-administered medications prior to visit.       Objective:   Physical Exam:  General appearance: 46 y.o.,, female, NAD, conversant  Eyes: anicteric sclerae; PERRL, tracking appropriately HENT: NCAT; MMM Neck: Trachea midline; no lymphadenopathy, no JVD Lungs: CTAB, no crackles, no wheeze, with normal respiratory effort CV: RRR, no murmur  Abdomen: Soft, non-tender; non-distended, BS present  Extremities: No peripheral edema, warm Skin: Normal turgor and texture; no rash Psych: Appropriate affect Neuro: Alert and oriented to [name] and place, no focal deficit     There were no vitals filed for this visit.   on *** LPM *** RA BMI Readings from Last 3 Encounters:  04/28/14 52.83 kg/m  08/27/13 47.89 kg/m  04/20/13 48.81 kg/m   Wt Readings from Last 3 Encounters:  04/28/14 298 lb 4 oz (135.3 kg)  08/27/13 283 lb 6.4 oz (128.5 kg)  04/20/13 288 lb 12.8 oz (131 kg)     CBC    Component Value Date/Time   WBC 9.7 05/24/2015 2056   RBC 4.19 05/24/2015 2056   HGB 12.3 05/24/2015 2056   HCT 39.3 05/24/2015 2056   PLT 294 05/24/2015 2056   MCV 93.8 05/24/2015 2056   MCH 29.4 05/24/2015 2056   MCHC 31.3 05/24/2015 2056   RDW 14.3 05/24/2015 2056   LYMPHSABS 3.7 04/28/2014 1709   MONOABS 0.5 04/28/2014 1709   EOSABS 0.4 04/28/2014 1709   BASOSABS 0.0 04/28/2014 1709   Vbg 11/23/21 7.3/66   Chest Imaging: CT A/P lung bases reviewed by me unremarkable  CTA Chest 11/11/21 OSH report reviewed by me with scattered areas of ggo throughout the lung, atelectasis, enlarged PA trunk 4cm, no PE -  cannot view in Canopy  Pulmonary Functions Testing Results:     No data to display         Inpatient PSG 11/16/21: Hypoxemia - the patient had hypoxemia during awake, worse in sleep.    Nocturnal oxygen saturation in the range of 79-85% without supplemental  oxygen. Hypoxemia improved by supplemental oxygen at 1.0 LPM.  Severe hypercapnia - the patient's transcutaneous CO2 measurements were in  the 80's throughout the night.  Hypoxemia and hypercapnia are consistent  with hypoventilation, likely  obesity related.  This study did not meet the diagnostic criteria for OSA with the overall  AHI = 1.3; however the overall AHI likely is an underestimation of the  severity of the patient's OSA due to supplemental oxygen and strict  scoring criteria (4% desats for hypopneas).   Recommendations  The patient should be considered for treatment for hypoventilation.  Bilevel PAP therapy  is the most effective and recommended therapy. The  patient needs to return for a BIPAP titration study.  In the interval, supplemental oxygen at 1.0 LPM. .  Additional adjunctive measures include avoid sleeping in supine, avoidance  of respiratory suppressants, and treatment of nasal rhinitis if present.    As a long term goal, the patient may benefit from weight loss surgery.   Echocardiogram:   TTE 11/12/21:   1. The left ventricle is normal in size with normal wall thickness.    2. The left ventricular systolic function is probably normal, LVEF is  visually estimated at > 55%.    3. The right ventricle is not optimally imaged but is probably normal in  size, with normal systolic function.    4. There are no significant valvular abnormalities.    5. Limited study to assess ventricular function.    6. Technically difficult study.    7. An ultrasound enhancing agent was used to improve the visualization of  the left ventricular cavity and endocardial borders.       Assessment & Plan:     Plan:      Vanessa Person, MD  Pulmonary Critical Care 01/08/2022 3:18 PM

## 2022-01-09 ENCOUNTER — Institutional Professional Consult (permissible substitution): Payer: Managed Care, Other (non HMO) | Admitting: Student

## 2022-01-21 ENCOUNTER — Institutional Professional Consult (permissible substitution): Payer: Managed Care, Other (non HMO) | Admitting: Pulmonary Disease

## 2022-01-25 ENCOUNTER — Institutional Professional Consult (permissible substitution): Payer: Managed Care, Other (non HMO) | Admitting: Internal Medicine

## 2022-02-04 DIAGNOSIS — R69 Illness, unspecified: Secondary | ICD-10-CM | POA: Diagnosis not present

## 2022-02-04 DIAGNOSIS — F339 Major depressive disorder, recurrent, unspecified: Secondary | ICD-10-CM | POA: Diagnosis not present

## 2022-02-14 ENCOUNTER — Institutional Professional Consult (permissible substitution): Payer: Managed Care, Other (non HMO) | Admitting: Pulmonary Disease

## 2022-02-14 NOTE — Progress Notes (Deleted)
Synopsis: Referred in August 2023 for respiratory failure by Lonie Peak, PA  Subjective:   PATIENT ID: Vanessa [name] GENDER: female DOB: Jul 09, 1975, MRN: [id number]   HPI  No chief complaint on file.  Vanessa [name] is a 46 year old   Patient recently admitted at [facility] 11/12/21.  Past Medical History:  Diagnosis Date   Anxiety    Chronic headaches    Gallstones    GERD (gastroesophageal reflux disease)    History of kidney stones    PCOS (polycystic ovarian syndrome)    Takes metformin    Thyroid disease      Family History  Problem Relation Age of Onset   Crohn's disease Brother    Irritable bowel syndrome Mother    Hypertension Mother    Thyroid cancer Mother    Diabetes Father    Hypertension Father    Cancer Father        sarcoma with mets   Leukemia Maternal Grandfather    Colon cancer Neg Hx      Social History   Socioeconomic History   Marital status: Married    Spouse name: Not on file   Number of children: 1   Years of education: Not on file   Highest education level: Not on file  Occupational History   Occupation: Homemaker  Tobacco Use   Smoking status: Never   Smokeless tobacco: Never  Substance and Sexual Activity   Alcohol use: No   Drug use: No   Sexual activity: Never  Other Topics Concern   Not on file  Social History Narrative   Sodas rare    Social Determinants of Health   Financial Resource Strain: Not on file  Food Insecurity: Not on file  Transportation Needs: Not on file  Physical Activity: Not on file  Stress: Not on file  Social Connections: Not on file  Intimate Partner Violence: Not on file     Allergies  Allergen Reactions   Ciprofloxacin In D5w Rash    Pt states developed rash on Lt arm when IV Cipro infusing   Iodine    Betadine [Povidone Iodine] Hives, Itching and Rash   Latex Hives, Itching and Rash     Outpatient Medications Prior to Visit  Medication Sig Dispense Refill   ALPRAZolam  (XANAX XR) 2 MG 24 hr tablet Take 2 mg by mouth 2 (two) times daily.  3   amitriptyline (ELAVIL) 25 MG tablet Take 25 mg by mouth daily.  3   benztropine (COGENTIN) 0.5 MG tablet Take 1 tablet (0.5 mg total) by mouth 2 (two) times daily. (Patient not taking: Reported on 05/05/2018)     escitalopram (LEXAPRO) 5 MG tablet Take by mouth.     estradiol (VIVELLE-DOT) 0.1 MG/24HR patch Place one patch to skin, change patch every 3 days     furosemide (LASIX) 40 MG tablet TAKE 1.5 TABLETS BY MOUTH EVERY DAY  3   haloperidol (HALDOL) 2 MG tablet Take 1 tablet (2 mg total) by mouth 2 (two) times daily. (Patient not taking: Reported on 05/05/2018)     hydrOXYzine (ATARAX/VISTARIL) 25 MG tablet Take 1 tablet (25 mg total) by mouth 3 (three) times daily as needed for anxiety. (Patient not taking: Reported on 05/05/2018) 30 tablet 0   lansoprazole (PREVACID) 30 MG capsule Take 30 mg by mouth 2 (two) times daily.  0   levothyroxine (SYNTHROID, LEVOTHROID) 175 MCG tablet Take 175 mcg by mouth daily.  3   medroxyPROGESTERone (PROVERA)  10 MG tablet Take 20 mg by mouth daily.  3   MELATONIN-PYRIDOXINE PO Take by mouth.     meloxicam (MOBIC) 15 MG tablet TAKE 1 TABLET BY MOUTH EVERY DAY 30 tablet 3   metFORMIN (GLUCOPHAGE-XR) 750 MG 24 hr tablet Take 750 mg by mouth daily.  3   progesterone (PROMETRIUM) 200 MG capsule Insert one tablet vaginally twice daily     propranolol ER (INDERAL LA) 80 MG 24 hr capsule TAKE 1 CAPSULE BY MOUTH TWICE DAILY FOR MIGRAINE PREVENTION  5   sulfamethoxazole-trimethoprim (BACTRIM DS,SEPTRA DS) 800-160 MG tablet Take 1 tablet by mouth 2 (two) times daily.  0   Vitamin D, Ergocalciferol, (DRISDOL) 1.25 MG (50000 UT) CAPS capsule Take by mouth.     No facility-administered medications prior to visit.    ROS    Objective:  There were no vitals filed for this visit.   Physical Exam    CBC    Component Value Date/Time   WBC 9.7 05/24/2015 2056   RBC 4.19 05/24/2015 2056    HGB 12.3 05/24/2015 2056   HCT 39.3 05/24/2015 2056   PLT 294 05/24/2015 2056   MCV 93.8 05/24/2015 2056   MCH 29.4 05/24/2015 2056   MCHC 31.3 05/24/2015 2056   RDW 14.3 05/24/2015 2056   LYMPHSABS 3.7 04/28/2014 1709   MONOABS 0.5 04/28/2014 1709   EOSABS 0.4 04/28/2014 1709   BASOSABS 0.0 04/28/2014 1709     Chest imaging:  PFT:     No data to display          Labs:  Path:  Echo:  Heart Catheterization:       Assessment & Plan:   No diagnosis found.  Discussion: ***    Current Outpatient Medications:    ALPRAZolam (XANAX XR) 2 MG 24 hr tablet, Take 2 mg by mouth 2 (two) times daily., Disp: , Rfl: 3   amitriptyline (ELAVIL) 25 MG tablet, Take 25 mg by mouth daily., Disp: , Rfl: 3   benztropine (COGENTIN) 0.5 MG tablet, Take 1 tablet (0.5 mg total) by mouth 2 (two) times daily. (Patient not taking: Reported on 05/05/2018), Disp: , Rfl:    escitalopram (LEXAPRO) 5 MG tablet, Take by mouth., Disp: , Rfl:    estradiol (VIVELLE-DOT) 0.1 MG/24HR patch, Place one patch to skin, change patch every 3 days, Disp: , Rfl:    furosemide (LASIX) 40 MG tablet, TAKE 1.5 TABLETS BY MOUTH EVERY DAY, Disp: , Rfl: 3   haloperidol (HALDOL) 2 MG tablet, Take 1 tablet (2 mg total) by mouth 2 (two) times daily. (Patient not taking: Reported on 05/05/2018), Disp: , Rfl:    hydrOXYzine (ATARAX/VISTARIL) 25 MG tablet, Take 1 tablet (25 mg total) by mouth 3 (three) times daily as needed for anxiety. (Patient not taking: Reported on 05/05/2018), Disp: 30 tablet, Rfl: 0   lansoprazole (PREVACID) 30 MG capsule, Take 30 mg by mouth 2 (two) times daily., Disp: , Rfl: 0   levothyroxine (SYNTHROID, LEVOTHROID) 175 MCG tablet, Take 175 mcg by mouth daily., Disp: , Rfl: 3   medroxyPROGESTERone (PROVERA) 10 MG tablet, Take 20 mg by mouth daily., Disp: , Rfl: 3   MELATONIN-PYRIDOXINE PO, Take by mouth., Disp: , Rfl:    meloxicam (MOBIC) 15 MG tablet, TAKE 1 TABLET BY MOUTH EVERY DAY, Disp: 30  tablet, Rfl: 3   metFORMIN (GLUCOPHAGE-XR) 750 MG 24 hr tablet, Take 750 mg by mouth daily., Disp: , Rfl: 3   progesterone (PROMETRIUM) 200 MG  capsule, Insert one tablet vaginally twice daily, Disp: , Rfl:    propranolol ER (INDERAL LA) 80 MG 24 hr capsule, TAKE 1 CAPSULE BY MOUTH TWICE DAILY FOR MIGRAINE PREVENTION, Disp: , Rfl: 5   sulfamethoxazole-trimethoprim (BACTRIM DS,SEPTRA DS) 800-160 MG tablet, Take 1 tablet by mouth 2 (two) times daily., Disp: , Rfl: 0   Vitamin D, Ergocalciferol, (DRISDOL) 1.25 MG (50000 UT) CAPS capsule, Take by mouth., Disp: , Rfl:

## 2022-02-22 DIAGNOSIS — R69 Illness, unspecified: Secondary | ICD-10-CM | POA: Diagnosis not present

## 2022-03-12 ENCOUNTER — Institutional Professional Consult (permissible substitution): Payer: Managed Care, Other (non HMO) | Admitting: Pulmonary Disease

## 2022-03-28 ENCOUNTER — Encounter: Payer: Self-pay | Admitting: Pulmonary Disease

## 2022-03-28 ENCOUNTER — Ambulatory Visit (INDEPENDENT_AMBULATORY_CARE_PROVIDER_SITE_OTHER): Payer: 59 | Admitting: Pulmonary Disease

## 2022-03-28 VITALS — HR 69 | Temp 98.1°F

## 2022-03-28 DIAGNOSIS — E662 Morbid (severe) obesity with alveolar hypoventilation: Secondary | ICD-10-CM

## 2022-03-28 DIAGNOSIS — I10 Essential (primary) hypertension: Secondary | ICD-10-CM | POA: Diagnosis not present

## 2022-03-28 NOTE — Progress Notes (Signed)
Vanessa [name]    [id number]    1975/12/04  Primary Care Physician:Conroy, Tamala Julian  Referring Physician: Cyndi Bender, PA-C [address] [place],  [place] [address]  Chief complaint:   Patient being seen for chronic respiratory failure  HPI:  Was recently hospitalized with respiratory failure following a fall Progressive decompensation was felt to be related to antianxiety and opiates  At [facility] while inpatient showing CO2 retention up to 78.3 Inpatient sleep study with hypoxemia, nocturnal oxygen desaturations  Plan following discharge worse BiPAP versus trilogy ventilator -Decision was for a trilogy ventilator -Has not been initiated so far  Obesity hypoventilation  Fall was accidental  Patient has multiple comorbidities History of migraines, type 2 diabetes, hypothyroidism, GERD, pulmonary hypertension, poorly controlled hypertension, deconditioning, musculoskeletal pain and discomfort  Ambulates with a cane  Non-smoker  Suffers from chronic pain, migraines  Outpatient Encounter Medications as of 03/28/2022  Medication Sig   amitriptyline (ELAVIL) 25 MG tablet Take 25 mg by mouth daily.   lansoprazole (PREVACID) 30 MG capsule Take 30 mg by mouth 2 (two) times daily.   MELATONIN-PYRIDOXINE PO Take by mouth.   meloxicam (MOBIC) 15 MG tablet TAKE 1 TABLET BY MOUTH EVERY DAY   propranolol ER (INDERAL LA) 80 MG 24 hr capsule TAKE 1 CAPSULE BY MOUTH TWICE DAILY FOR MIGRAINE PREVENTION   [DISCONTINUED] ALPRAZolam (XANAX XR) 2 MG 24 hr tablet Take 2 mg by mouth 2 (two) times daily.   [DISCONTINUED] benztropine (COGENTIN) 0.5 MG tablet Take 1 tablet (0.5 mg total) by mouth 2 (two) times daily. (Patient not taking: Reported on 05/05/2018)   [DISCONTINUED] escitalopram (LEXAPRO) 5 MG tablet Take by mouth.   [DISCONTINUED] estradiol (VIVELLE-DOT) 0.1 MG/24HR patch Place one patch to skin, change patch every 3 days   [DISCONTINUED] furosemide (LASIX) 40 MG  tablet TAKE 1.5 TABLETS BY MOUTH EVERY DAY   [DISCONTINUED] haloperidol (HALDOL) 2 MG tablet Take 1 tablet (2 mg total) by mouth 2 (two) times daily. (Patient not taking: Reported on 05/05/2018)   [DISCONTINUED] hydrOXYzine (ATARAX/VISTARIL) 25 MG tablet Take 1 tablet (25 mg total) by mouth 3 (three) times daily as needed for anxiety. (Patient not taking: Reported on 05/05/2018)   [DISCONTINUED] levothyroxine (SYNTHROID, LEVOTHROID) 175 MCG tablet Take 175 mcg by mouth daily.   [DISCONTINUED] medroxyPROGESTERone (PROVERA) 10 MG tablet Take 20 mg by mouth daily.   [DISCONTINUED] metFORMIN (GLUCOPHAGE-XR) 750 MG 24 hr tablet Take 750 mg by mouth daily.   [DISCONTINUED] progesterone (PROMETRIUM) 200 MG capsule Insert one tablet vaginally twice daily   [DISCONTINUED] sulfamethoxazole-trimethoprim (BACTRIM DS,SEPTRA DS) 800-160 MG tablet Take 1 tablet by mouth 2 (two) times daily.   [DISCONTINUED] Vitamin D, Ergocalciferol, (DRISDOL) 1.25 MG (50000 UT) CAPS capsule Take by mouth.   No facility-administered encounter medications on file as of 03/28/2022.    Allergies as of 03/28/2022 - Review Complete 03/28/2022  Allergen Reaction Noted   Ciprofloxacin in d5w Rash 02/12/2013   Betadine [povidone iodine] Hives, Itching, and Rash 02/06/2013   Iodine  02/10/2013   Latex Hives, Itching, and Rash 02/06/2013    Past Medical History:  Diagnosis Date   Anxiety    Chronic headaches    Gallstones    GERD (gastroesophageal reflux disease)    History of kidney stones    PCOS (polycystic ovarian syndrome)    Takes metformin    Thyroid disease     Past Surgical History:  Procedure Laterality Date   CESAREAN SECTION  CHOLECYSTECTOMY     CHOLECYSTECTOMY N/A 02/12/2013   Procedure: LAPAROSCOPIC CHOLECYSTECTOMY WITH INTRAOPERATIVE CHOLANGIOGRAM;  Surgeon: Romie Levee, MD;  Location: [facility];  Service: General;  Laterality: N/A;   ERCP  02/13/2013   Dr. Madilyn Fireman    Family History  Problem Relation  Age of Onset   Crohn's disease Brother    Irritable bowel syndrome Mother    Hypertension Mother    Thyroid cancer Mother    Diabetes Father    Hypertension Father    Cancer Father        sarcoma with mets   Leukemia Maternal Grandfather    Colon cancer Neg Hx     Social History   Socioeconomic History   Marital status: Married    Spouse name: Not on file   Number of children: 1   Years of education: Not on file   Highest education level: Not on file  Occupational History   Occupation: Homemaker  Tobacco Use   Smoking status: Never   Smokeless tobacco: Never  Substance and Sexual Activity   Alcohol use: No   Drug use: No   Sexual activity: Never  Other Topics Concern   Not on file  Social History Narrative   Sodas rare    Social Determinants of Health   Financial Resource Strain: Not on file  Food Insecurity: Not on file  Transportation Needs: Not on file  Physical Activity: Not on file  Stress: Not on file  Social Connections: Not on file  Intimate Partner Violence: Not on file    Review of Systems  Constitutional:  Positive for fatigue.  Respiratory:  Positive for shortness of breath.     Vitals:   03/28/22 1516  Pulse: 69  Temp: 98.1 F (36.7 C)  SpO2: 90%     Physical Exam Constitutional:      Appearance: She is obese.  HENT:     Head: Normocephalic.     Mouth/Throat:     Mouth: Mucous membranes are moist.  Cardiovascular:     Rate and Rhythm: Normal rate and regular rhythm.     Heart sounds: No murmur heard. Pulmonary:     Effort: No respiratory distress.     Breath sounds: No stridor. No wheezing or rhonchi.  Musculoskeletal:     Cervical back: No rigidity or tenderness.  Neurological:     Mental Status: She is alert.  Psychiatric:        Mood and Affect: Mood normal.    Data Reviewed: Records from recent discharge from [facility] reviewed  Assessment:  Chronic hypercapnic respiratory failure  Obesity hypoventilation  Class III  obesity  Deconditioning  History of hypertension  Probable pulmonary hypertension  Chronic pain and discomfort  History of anxiety/depression  Plan/Recommendations: Did speak with representative from [employer]  Patient will be initiated on trilogy ventilator  Physical therapy  Graded activities as tolerated  Importance of weight loss discussed with patient  Tentative follow-up in 3 months  Patient encouraged to call with significant concerns   Virl Diamond MD [facility] 03/28/2022, 4:04 PM  CC: Lonie Peak, PA-C

## 2022-03-28 NOTE — Patient Instructions (Signed)
I will be in touch with Shanon Brow from [employer] [phone]  We will start you on a trilogy ventilator at night   Graded exercise as tolerated-goal is to achieve some weight loss  Continue to talk with your primary doctor regarding any assistance that can be provided  I will see you in about 4 to 6 weeks  Call with significant concerns

## 2022-04-09 DIAGNOSIS — R918 Other nonspecific abnormal finding of lung field: Secondary | ICD-10-CM | POA: Diagnosis not present

## 2022-04-09 DIAGNOSIS — Z7984 Long term (current) use of oral hypoglycemic drugs: Secondary | ICD-10-CM | POA: Diagnosis not present

## 2022-04-09 DIAGNOSIS — J9622 Acute and chronic respiratory failure with hypercapnia: Secondary | ICD-10-CM | POA: Diagnosis not present

## 2022-04-09 DIAGNOSIS — G8929 Other chronic pain: Secondary | ICD-10-CM | POA: Diagnosis not present

## 2022-04-09 DIAGNOSIS — E662 Morbid (severe) obesity with alveolar hypoventilation: Secondary | ICD-10-CM | POA: Diagnosis not present

## 2022-04-09 DIAGNOSIS — E119 Type 2 diabetes mellitus without complications: Secondary | ICD-10-CM | POA: Diagnosis not present

## 2022-04-09 DIAGNOSIS — I10 Essential (primary) hypertension: Secondary | ICD-10-CM | POA: Diagnosis not present

## 2022-04-09 DIAGNOSIS — J9811 Atelectasis: Secondary | ICD-10-CM | POA: Diagnosis not present

## 2022-04-09 DIAGNOSIS — Z6841 Body Mass Index (BMI) 40.0 and over, adult: Secondary | ICD-10-CM | POA: Diagnosis not present

## 2022-04-09 DIAGNOSIS — Z91041 Radiographic dye allergy status: Secondary | ICD-10-CM | POA: Diagnosis not present

## 2022-04-09 DIAGNOSIS — K047 Periapical abscess without sinus: Secondary | ICD-10-CM | POA: Diagnosis not present

## 2022-04-09 DIAGNOSIS — Z1152 Encounter for screening for COVID-19: Secondary | ICD-10-CM | POA: Diagnosis not present

## 2022-04-09 DIAGNOSIS — J9621 Acute and chronic respiratory failure with hypoxia: Secondary | ICD-10-CM | POA: Diagnosis not present

## 2022-04-09 DIAGNOSIS — B372 Candidiasis of skin and nail: Secondary | ICD-10-CM | POA: Diagnosis not present

## 2022-04-09 DIAGNOSIS — I509 Heart failure, unspecified: Secondary | ICD-10-CM | POA: Diagnosis not present

## 2022-04-09 DIAGNOSIS — G43909 Migraine, unspecified, not intractable, without status migrainosus: Secondary | ICD-10-CM | POA: Diagnosis not present

## 2022-04-09 DIAGNOSIS — R69 Illness, unspecified: Secondary | ICD-10-CM | POA: Diagnosis not present

## 2022-04-09 DIAGNOSIS — F419 Anxiety disorder, unspecified: Secondary | ICD-10-CM | POA: Diagnosis not present

## 2022-04-09 DIAGNOSIS — K219 Gastro-esophageal reflux disease without esophagitis: Secondary | ICD-10-CM | POA: Diagnosis not present

## 2022-04-09 DIAGNOSIS — E039 Hypothyroidism, unspecified: Secondary | ICD-10-CM | POA: Diagnosis not present

## 2022-04-11 DIAGNOSIS — E039 Hypothyroidism, unspecified: Secondary | ICD-10-CM | POA: Diagnosis not present

## 2022-04-11 DIAGNOSIS — G8929 Other chronic pain: Secondary | ICD-10-CM | POA: Diagnosis not present

## 2022-04-11 DIAGNOSIS — I272 Pulmonary hypertension, unspecified: Secondary | ICD-10-CM | POA: Diagnosis not present

## 2022-04-11 DIAGNOSIS — Z79899 Other long term (current) drug therapy: Secondary | ICD-10-CM | POA: Diagnosis not present

## 2022-04-11 DIAGNOSIS — E119 Type 2 diabetes mellitus without complications: Secondary | ICD-10-CM | POA: Diagnosis not present

## 2022-04-11 DIAGNOSIS — B379 Candidiasis, unspecified: Secondary | ICD-10-CM | POA: Diagnosis not present

## 2022-04-11 DIAGNOSIS — K047 Periapical abscess without sinus: Secondary | ICD-10-CM | POA: Diagnosis not present

## 2022-04-11 DIAGNOSIS — G43909 Migraine, unspecified, not intractable, without status migrainosus: Secondary | ICD-10-CM | POA: Diagnosis not present

## 2022-04-11 DIAGNOSIS — J9621 Acute and chronic respiratory failure with hypoxia: Secondary | ICD-10-CM | POA: Diagnosis not present

## 2022-04-11 DIAGNOSIS — K219 Gastro-esophageal reflux disease without esophagitis: Secondary | ICD-10-CM | POA: Diagnosis not present

## 2022-04-11 DIAGNOSIS — J9622 Acute and chronic respiratory failure with hypercapnia: Secondary | ICD-10-CM | POA: Diagnosis not present

## 2022-04-11 DIAGNOSIS — E662 Morbid (severe) obesity with alveolar hypoventilation: Secondary | ICD-10-CM | POA: Diagnosis not present

## 2022-04-12 DIAGNOSIS — J9610 Chronic respiratory failure, unspecified whether with hypoxia or hypercapnia: Secondary | ICD-10-CM | POA: Diagnosis not present

## 2022-04-12 DIAGNOSIS — J9621 Acute and chronic respiratory failure with hypoxia: Secondary | ICD-10-CM | POA: Diagnosis not present

## 2022-04-12 DIAGNOSIS — J449 Chronic obstructive pulmonary disease, unspecified: Secondary | ICD-10-CM | POA: Diagnosis not present

## 2022-04-18 ENCOUNTER — Telehealth: Payer: Self-pay | Admitting: Pulmonary Disease

## 2022-04-18 NOTE — Telephone Encounter (Signed)
I have looked in Dr. Judson Roch box up front and in A pod and cannot locate the fax that was sent to [place] on 04/16/22.  Will send to the [facility] pool to see if they sent the order.  PCCs:  Did anyone fax an order to [employer] for a vent order on this patient?  If so, please refax, they only received the cover sheet.  Thank you.

## 2022-04-22 NOTE — Telephone Encounter (Signed)
ATC Vanessa [name] ([employer] RT), Left detailed vm that the order was not faxed by our office [facility]), it was started likely during her most recent hospitalization and she has not had a f/u with Dr. Ander Slade since that time.  Advised he can fax the form to our office so we will have it when the patient has her f/u on 11/29 at 3pm.  I will route this message to Dr. Ander Slade so he will be aware.  Dr. Ander Slade, please be aware RT from [employer] is sending paperwork regarding a vent that was started during her last hospitalization.  She has a f/u with you on 11/29.  Thank you.

## 2022-04-26 NOTE — Telephone Encounter (Signed)
Will see at follow-up

## 2022-05-13 DIAGNOSIS — J9610 Chronic respiratory failure, unspecified whether with hypoxia or hypercapnia: Secondary | ICD-10-CM | POA: Diagnosis not present

## 2022-05-13 DIAGNOSIS — J449 Chronic obstructive pulmonary disease, unspecified: Secondary | ICD-10-CM | POA: Diagnosis not present

## 2022-05-13 DIAGNOSIS — J9621 Acute and chronic respiratory failure with hypoxia: Secondary | ICD-10-CM | POA: Diagnosis not present

## 2022-05-15 ENCOUNTER — Ambulatory Visit: Payer: 59 | Admitting: Pulmonary Disease

## 2022-05-16 NOTE — Telephone Encounter (Signed)
Patient cancelled appointment for 11/29, she is rescheduled for 12/7.  Dr. Wynona Neat will address at this visit.  Nothing further needed.

## 2022-05-21 DIAGNOSIS — J811 Chronic pulmonary edema: Secondary | ICD-10-CM | POA: Diagnosis not present

## 2022-05-21 DIAGNOSIS — D649 Anemia, unspecified: Secondary | ICD-10-CM | POA: Diagnosis not present

## 2022-05-21 DIAGNOSIS — J9622 Acute and chronic respiratory failure with hypercapnia: Secondary | ICD-10-CM | POA: Diagnosis not present

## 2022-05-21 DIAGNOSIS — E119 Type 2 diabetes mellitus without complications: Secondary | ICD-10-CM | POA: Diagnosis not present

## 2022-05-21 DIAGNOSIS — J9621 Acute and chronic respiratory failure with hypoxia: Secondary | ICD-10-CM | POA: Diagnosis not present

## 2022-05-21 DIAGNOSIS — E662 Morbid (severe) obesity with alveolar hypoventilation: Secondary | ICD-10-CM | POA: Diagnosis not present

## 2022-05-21 DIAGNOSIS — Z79899 Other long term (current) drug therapy: Secondary | ICD-10-CM | POA: Diagnosis not present

## 2022-05-21 DIAGNOSIS — B372 Candidiasis of skin and nail: Secondary | ICD-10-CM | POA: Diagnosis not present

## 2022-05-21 DIAGNOSIS — Z23 Encounter for immunization: Secondary | ICD-10-CM | POA: Diagnosis not present

## 2022-05-23 ENCOUNTER — Ambulatory Visit: Payer: 59 | Admitting: Pulmonary Disease

## 2022-05-24 DIAGNOSIS — R69 Illness, unspecified: Secondary | ICD-10-CM | POA: Diagnosis not present

## 2022-06-12 DIAGNOSIS — J9621 Acute and chronic respiratory failure with hypoxia: Secondary | ICD-10-CM | POA: Diagnosis not present

## 2022-06-12 DIAGNOSIS — J449 Chronic obstructive pulmonary disease, unspecified: Secondary | ICD-10-CM | POA: Diagnosis not present

## 2022-06-12 DIAGNOSIS — J9610 Chronic respiratory failure, unspecified whether with hypoxia or hypercapnia: Secondary | ICD-10-CM | POA: Diagnosis not present

## 2022-06-28 ENCOUNTER — Ambulatory Visit: Payer: 59 | Admitting: Pulmonary Disease

## 2022-07-18 ENCOUNTER — Ambulatory Visit: Payer: 59 | Admitting: Pulmonary Disease

## 2022-08-02 ENCOUNTER — Ambulatory Visit: Payer: Commercial Managed Care - HMO | Admitting: Pulmonary Disease

## 2022-08-16 ENCOUNTER — Ambulatory Visit: Payer: Commercial Managed Care - HMO | Admitting: Pulmonary Disease

## 2022-08-21 ENCOUNTER — Ambulatory Visit: Payer: Commercial Managed Care - HMO | Admitting: Pulmonary Disease

## 2022-09-05 ENCOUNTER — Ambulatory Visit: Payer: Commercial Managed Care - HMO | Admitting: Pulmonary Disease

## 2022-09-06 ENCOUNTER — Ambulatory Visit: Payer: Commercial Managed Care - HMO | Admitting: Pulmonary Disease

## 2022-09-17 ENCOUNTER — Ambulatory Visit: Payer: Commercial Managed Care - HMO | Admitting: Pulmonary Disease

## 2022-09-30 ENCOUNTER — Ambulatory Visit: Payer: Commercial Managed Care - HMO | Admitting: Primary Care

## 2022-10-14 ENCOUNTER — Ambulatory Visit: Payer: Commercial Managed Care - HMO | Admitting: Nurse Practitioner

## 2022-10-29 ENCOUNTER — Ambulatory Visit: Payer: Commercial Managed Care - HMO | Admitting: Pulmonary Disease

## 2022-11-07 ENCOUNTER — Encounter: Payer: Self-pay | Admitting: Pulmonary Disease

## 2022-11-07 ENCOUNTER — Ambulatory Visit: Payer: Commercial Managed Care - HMO | Admitting: Pulmonary Disease

## 2022-11-20 ENCOUNTER — Telehealth (INDEPENDENT_AMBULATORY_CARE_PROVIDER_SITE_OTHER): Payer: Commercial Managed Care - HMO | Admitting: Pulmonary Disease

## 2022-11-20 DIAGNOSIS — E662 Morbid (severe) obesity with alveolar hypoventilation: Secondary | ICD-10-CM

## 2022-11-20 DIAGNOSIS — J9610 Chronic respiratory failure, unspecified whether with hypoxia or hypercapnia: Secondary | ICD-10-CM

## 2022-11-20 NOTE — Patient Instructions (Addendum)
Follow-up in about 3 months  Graded activities as tolerated  Continue nocturnal ventilator at night  Continue oxygen supplementation during the day-try and wean as tolerated to saturations greater than 90%

## 2022-11-20 NOTE — Progress Notes (Deleted)
.  virt 

## 2022-11-20 NOTE — Progress Notes (Signed)
Virtual Visit via Video Note  I connected with Vanessa [name] on 11/20/22 at  3:30 PM EDT by a video enabled telemedicine application and verified that I am speaking with the correct person using two identifiers.  Location: Patient: Patient located at home Provider: Located in office on [address] [place]   I discussed the limitations of evaluation and management by telemedicine and the availability of in person appointments. The patient expressed understanding and agreed to proceed.  History of Present Illness: Patient with history of obesity hypoventilation, chronic respiratory failure Being seen for follow-up today Breathing has been relatively stable Currently on about 8 L of oxygen Uses ventilator at night Functioning well Tolerating vent well Has not had any significant problems recently     Observations/Objective: She looks well Denies any pain or discomfort  Able to get around the house with limitations, ambulates with a cane   Assessment and Plan: Obesity hypoventilation -Continue nocturnal ventilator use  Chronic respiratory failure -Encouraged to procure a pulse oximeter and adjust oxygen supplementation  Consideration for portable concentrator -I did encourage her to gradually wean oxygen to keep saturations greater than 90 She will need ambulated in the office to ensure that portable concentrator will work for her  Deconditioning -Appears to be doing relatively well  History of anxiety/depression -Controlled at present  Class III obesity -Continues to work on weight loss  Follow Up Instructions:    I discussed the assessment and treatment plan with the patient. The patient was provided an opportunity to ask questions and all were answered. The patient agreed with the plan and demonstrated an understanding of the instructions.   The patient was advised to call back or seek an in-person evaluation if the symptoms worsen or if the condition  fails to improve as anticipated.  I provided 25 minutes of non-face-to-face time during this encounter.   Tomma Lightning, MD

## 2023-02-13 ENCOUNTER — Telehealth: Payer: Self-pay | Admitting: Pulmonary Disease

## 2023-02-13 NOTE — Telephone Encounter (Signed)
[employer] calling to say this PT needs to requalify for O2.

## 2023-02-19 NOTE — Telephone Encounter (Signed)
Left voicemail for patient to call back to schedule follow up. 

## 2023-02-19 NOTE — Telephone Encounter (Signed)
Please schedule oxygen re-certification.

## 2023-05-12 ENCOUNTER — Telehealth: Payer: Commercial Managed Care - HMO | Admitting: Pulmonary Disease

## 2023-05-12 DIAGNOSIS — E662 Morbid (severe) obesity with alveolar hypoventilation: Secondary | ICD-10-CM | POA: Diagnosis not present

## 2023-05-12 DIAGNOSIS — I10 Essential (primary) hypertension: Secondary | ICD-10-CM | POA: Diagnosis not present

## 2023-05-12 DIAGNOSIS — E66813 Obesity, class 3: Secondary | ICD-10-CM

## 2023-05-12 NOTE — Patient Instructions (Signed)
Continue graded activities  Continue ventilator at night  Continue oxygen use  Follow-up in 6 months

## 2023-05-12 NOTE — Progress Notes (Signed)
Virtual Visit via Video Note  I connected with Vanessa [name] on 05/12/23 at  3:15 PM EST by a video enabled telemedicine application and verified that I am speaking with the correct person using two identifiers.  Location: Patient: home Provider: office   I discussed the limitations of evaluation and management by telemedicine and the availability of in person appointments. The patient expressed understanding and agreed to proceed.  History of Present Illness: Patient with chronic respiratory failure History of obesity hypoventilation syndrome Has been relatively stable  Currently on about 7 L of oxygen Uses a ventilator at night  Has been doing relatively well  No recent new exacerbations Has not been to the hospital or need urgent care lately  She has been doing relatively well  She has been doing some yoga and getting more active   Observations/Objective: She looks well Denies any pain or discomfort  Does appear comfortable with oxygen supplementation  Ambulates with a cane  Assessment and Plan:  Obesity hypoventilation -Continue nocturnal ventilator use  Chronic respiratory failure Continue oxygen supplementation Goal is to be able to get on a portable concentrator but she is still on 7 L at present  History of anxiety/depression -This is controlled at present  Class III obesity -Continue weight loss efforts   Follow Up Instructions: Follow-up in about 6 months  Encouraged to call with significant concerns   I discussed the assessment and treatment plan with the patient. The patient was provided an opportunity to ask questions and all were answered. The patient agreed with the plan and demonstrated an understanding of the instructions.   The patient was advised to call back or seek an in-person evaluation if the symptoms worsen or if the condition fails to improve as anticipated.  I provided 21 minutes of non-face-to-face time during this  encounter.   Tomma Lightning, MD

## 2023-07-01 DIAGNOSIS — J9610 Chronic respiratory failure, unspecified whether with hypoxia or hypercapnia: Secondary | ICD-10-CM | POA: Diagnosis not present

## 2023-07-01 DIAGNOSIS — J449 Chronic obstructive pulmonary disease, unspecified: Secondary | ICD-10-CM | POA: Diagnosis not present

## 2023-07-01 DIAGNOSIS — J9621 Acute and chronic respiratory failure with hypoxia: Secondary | ICD-10-CM | POA: Diagnosis not present

## 2023-07-05 DIAGNOSIS — G4733 Obstructive sleep apnea (adult) (pediatric): Secondary | ICD-10-CM | POA: Diagnosis not present

## 2023-07-05 DIAGNOSIS — J9621 Acute and chronic respiratory failure with hypoxia: Secondary | ICD-10-CM | POA: Diagnosis not present

## 2023-07-05 DIAGNOSIS — R5381 Other malaise: Secondary | ICD-10-CM | POA: Diagnosis not present

## 2023-07-31 DIAGNOSIS — F1320 Sedative, hypnotic or anxiolytic dependence, uncomplicated: Secondary | ICD-10-CM | POA: Diagnosis not present

## 2023-07-31 DIAGNOSIS — F339 Major depressive disorder, recurrent, unspecified: Secondary | ICD-10-CM | POA: Diagnosis not present

## 2023-08-01 DIAGNOSIS — J9621 Acute and chronic respiratory failure with hypoxia: Secondary | ICD-10-CM | POA: Diagnosis not present

## 2023-08-01 DIAGNOSIS — J9610 Chronic respiratory failure, unspecified whether with hypoxia or hypercapnia: Secondary | ICD-10-CM | POA: Diagnosis not present

## 2023-08-01 DIAGNOSIS — J449 Chronic obstructive pulmonary disease, unspecified: Secondary | ICD-10-CM | POA: Diagnosis not present

## 2023-08-05 DIAGNOSIS — J9621 Acute and chronic respiratory failure with hypoxia: Secondary | ICD-10-CM | POA: Diagnosis not present

## 2023-08-05 DIAGNOSIS — G4733 Obstructive sleep apnea (adult) (pediatric): Secondary | ICD-10-CM | POA: Diagnosis not present

## 2023-08-05 DIAGNOSIS — R5381 Other malaise: Secondary | ICD-10-CM | POA: Diagnosis not present

## 2023-08-11 ENCOUNTER — Telehealth (INDEPENDENT_AMBULATORY_CARE_PROVIDER_SITE_OTHER): Payer: 59 | Admitting: Pulmonary Disease

## 2023-08-11 ENCOUNTER — Telehealth: Payer: Self-pay | Admitting: Pulmonary Disease

## 2023-08-11 DIAGNOSIS — E66813 Obesity, class 3: Secondary | ICD-10-CM

## 2023-08-11 DIAGNOSIS — E662 Morbid (severe) obesity with alveolar hypoventilation: Secondary | ICD-10-CM

## 2023-08-11 NOTE — Addendum Note (Signed)
 Addended by: Girtha Hake on: 08/11/2023 03:25 PM   Modules accepted: Orders

## 2023-08-11 NOTE — Progress Notes (Signed)
 Virtual Visit via Video Note  I connected with Vanessa [name] on 08/11/23 at  3:00 PM EST by a video enabled telemedicine application and verified that I am speaking with the correct person using two identifiers.  Location: Patient: At home Provider: In the office, [address]   I discussed the limitations of evaluation and management by telemedicine and the availability of in person appointments. The patient expressed understanding and agreed to proceed.  History of Present Illness: Breathing has been stable  Nocturnal ventilator use has been compliant  Denies any ongoing active symptoms at present Feeling better overall Trying to get more active  Does not need oxygen supplementation while at rest but does put it on with activity   Observations/Objective: Looks well, does not look short of breath at rest  Medications reviewed  Assessment and Plan:  Obesity hypoventilation -Continue nocturnal ventilator use  Chronic respiratory failure -Continue oxygen supplementation -Oxygen supplementation with activity  History of anxiety/depression -Controlled  Class III obesity -Continue weight loss efforts  Will request to see if [employer] may be able to do an ambulatory oximetry to qualify for portable concentrator with activity  Follow Up Instructions:  Follow-up in about 3 months I discussed the assessment and treatment plan with the patient. The patient was provided an opportunity to ask questions and all were answered. The patient agreed with the plan and demonstrated an understanding of the instructions.   The patient was advised to call back or seek an in-person evaluation if the symptoms worsen or if the condition fails to improve as anticipated.  I provided 22 minutes of non-face-to-face time during this encounter.   Tomma Lightning, MD

## 2023-08-11 NOTE — Patient Instructions (Signed)
 Will place an order to [employer]  -Ambulatory oximetry to qualify for portable oxygen  Continue using your oxygen as needed  Continue nocturnal ventilator use  Call us with significant concerns  Follow-up in 3 months

## 2023-08-11 NOTE — Telephone Encounter (Signed)
 Can you kindly order on ambulatory pulse oximetry to be done by [employer] [facility]  Qualification for portable concentrator to use with activity

## 2023-08-29 DIAGNOSIS — J9621 Acute and chronic respiratory failure with hypoxia: Secondary | ICD-10-CM | POA: Diagnosis not present

## 2023-08-29 DIAGNOSIS — J449 Chronic obstructive pulmonary disease, unspecified: Secondary | ICD-10-CM | POA: Diagnosis not present

## 2023-08-29 DIAGNOSIS — J9610 Chronic respiratory failure, unspecified whether with hypoxia or hypercapnia: Secondary | ICD-10-CM | POA: Diagnosis not present

## 2023-09-02 DIAGNOSIS — J9621 Acute and chronic respiratory failure with hypoxia: Secondary | ICD-10-CM | POA: Diagnosis not present

## 2023-09-02 DIAGNOSIS — R5381 Other malaise: Secondary | ICD-10-CM | POA: Diagnosis not present

## 2023-09-02 DIAGNOSIS — G4733 Obstructive sleep apnea (adult) (pediatric): Secondary | ICD-10-CM | POA: Diagnosis not present

## 2023-09-29 DIAGNOSIS — J449 Chronic obstructive pulmonary disease, unspecified: Secondary | ICD-10-CM | POA: Diagnosis not present

## 2023-09-29 DIAGNOSIS — J9610 Chronic respiratory failure, unspecified whether with hypoxia or hypercapnia: Secondary | ICD-10-CM | POA: Diagnosis not present

## 2023-09-29 DIAGNOSIS — J9621 Acute and chronic respiratory failure with hypoxia: Secondary | ICD-10-CM | POA: Diagnosis not present

## 2023-10-03 DIAGNOSIS — G4733 Obstructive sleep apnea (adult) (pediatric): Secondary | ICD-10-CM | POA: Diagnosis not present

## 2023-10-03 DIAGNOSIS — R5381 Other malaise: Secondary | ICD-10-CM | POA: Diagnosis not present

## 2023-10-03 DIAGNOSIS — J9621 Acute and chronic respiratory failure with hypoxia: Secondary | ICD-10-CM | POA: Diagnosis not present

## 2023-10-13 DIAGNOSIS — G629 Polyneuropathy, unspecified: Secondary | ICD-10-CM | POA: Diagnosis not present

## 2023-10-13 DIAGNOSIS — D509 Iron deficiency anemia, unspecified: Secondary | ICD-10-CM | POA: Diagnosis not present

## 2023-10-13 DIAGNOSIS — E039 Hypothyroidism, unspecified: Secondary | ICD-10-CM | POA: Diagnosis not present

## 2023-10-13 DIAGNOSIS — E662 Morbid (severe) obesity with alveolar hypoventilation: Secondary | ICD-10-CM | POA: Diagnosis not present

## 2023-10-13 DIAGNOSIS — G43009 Migraine without aura, not intractable, without status migrainosus: Secondary | ICD-10-CM | POA: Diagnosis not present

## 2023-10-13 DIAGNOSIS — K589 Irritable bowel syndrome without diarrhea: Secondary | ICD-10-CM | POA: Diagnosis not present

## 2023-10-13 DIAGNOSIS — E119 Type 2 diabetes mellitus without complications: Secondary | ICD-10-CM | POA: Diagnosis not present

## 2023-10-13 DIAGNOSIS — K219 Gastro-esophageal reflux disease without esophagitis: Secondary | ICD-10-CM | POA: Diagnosis not present

## 2023-10-13 DIAGNOSIS — I10 Essential (primary) hypertension: Secondary | ICD-10-CM | POA: Diagnosis not present

## 2023-10-13 DIAGNOSIS — I890 Lymphedema, not elsewhere classified: Secondary | ICD-10-CM | POA: Diagnosis not present

## 2023-11-02 DIAGNOSIS — J9621 Acute and chronic respiratory failure with hypoxia: Secondary | ICD-10-CM | POA: Diagnosis not present

## 2023-11-02 DIAGNOSIS — G4733 Obstructive sleep apnea (adult) (pediatric): Secondary | ICD-10-CM | POA: Diagnosis not present

## 2023-11-02 DIAGNOSIS — R5381 Other malaise: Secondary | ICD-10-CM | POA: Diagnosis not present

## 2023-11-28 DIAGNOSIS — F419 Anxiety disorder, unspecified: Secondary | ICD-10-CM | POA: Diagnosis not present

## 2023-11-28 DIAGNOSIS — G4730 Sleep apnea, unspecified: Secondary | ICD-10-CM | POA: Diagnosis not present

## 2023-11-28 DIAGNOSIS — F4310 Post-traumatic stress disorder, unspecified: Secondary | ICD-10-CM | POA: Diagnosis not present

## 2023-11-29 DIAGNOSIS — J449 Chronic obstructive pulmonary disease, unspecified: Secondary | ICD-10-CM | POA: Diagnosis not present

## 2023-11-29 DIAGNOSIS — J9610 Chronic respiratory failure, unspecified whether with hypoxia or hypercapnia: Secondary | ICD-10-CM | POA: Diagnosis not present

## 2023-11-29 DIAGNOSIS — J9621 Acute and chronic respiratory failure with hypoxia: Secondary | ICD-10-CM | POA: Diagnosis not present

## 2023-12-03 DIAGNOSIS — G4733 Obstructive sleep apnea (adult) (pediatric): Secondary | ICD-10-CM | POA: Diagnosis not present

## 2023-12-03 DIAGNOSIS — R5381 Other malaise: Secondary | ICD-10-CM | POA: Diagnosis not present

## 2023-12-03 DIAGNOSIS — J9621 Acute and chronic respiratory failure with hypoxia: Secondary | ICD-10-CM | POA: Diagnosis not present

## 2023-12-29 DIAGNOSIS — J449 Chronic obstructive pulmonary disease, unspecified: Secondary | ICD-10-CM | POA: Diagnosis not present

## 2023-12-29 DIAGNOSIS — J9621 Acute and chronic respiratory failure with hypoxia: Secondary | ICD-10-CM | POA: Diagnosis not present

## 2023-12-29 DIAGNOSIS — J9610 Chronic respiratory failure, unspecified whether with hypoxia or hypercapnia: Secondary | ICD-10-CM | POA: Diagnosis not present

## 2024-01-02 DIAGNOSIS — G4733 Obstructive sleep apnea (adult) (pediatric): Secondary | ICD-10-CM | POA: Diagnosis not present

## 2024-01-02 DIAGNOSIS — R5381 Other malaise: Secondary | ICD-10-CM | POA: Diagnosis not present

## 2024-01-02 DIAGNOSIS — J9621 Acute and chronic respiratory failure with hypoxia: Secondary | ICD-10-CM | POA: Diagnosis not present

## 2024-01-29 DIAGNOSIS — J449 Chronic obstructive pulmonary disease, unspecified: Secondary | ICD-10-CM | POA: Diagnosis not present

## 2024-01-29 DIAGNOSIS — J9621 Acute and chronic respiratory failure with hypoxia: Secondary | ICD-10-CM | POA: Diagnosis not present

## 2024-01-29 DIAGNOSIS — J9610 Chronic respiratory failure, unspecified whether with hypoxia or hypercapnia: Secondary | ICD-10-CM | POA: Diagnosis not present

## 2024-02-02 DIAGNOSIS — R5381 Other malaise: Secondary | ICD-10-CM | POA: Diagnosis not present

## 2024-02-02 DIAGNOSIS — J9621 Acute and chronic respiratory failure with hypoxia: Secondary | ICD-10-CM | POA: Diagnosis not present

## 2024-02-02 DIAGNOSIS — G4733 Obstructive sleep apnea (adult) (pediatric): Secondary | ICD-10-CM | POA: Diagnosis not present

## 2024-02-04 DIAGNOSIS — E662 Morbid (severe) obesity with alveolar hypoventilation: Secondary | ICD-10-CM | POA: Diagnosis not present

## 2024-02-04 DIAGNOSIS — G629 Polyneuropathy, unspecified: Secondary | ICD-10-CM | POA: Diagnosis not present

## 2024-02-04 DIAGNOSIS — E039 Hypothyroidism, unspecified: Secondary | ICD-10-CM | POA: Diagnosis not present

## 2024-02-04 DIAGNOSIS — I890 Lymphedema, not elsewhere classified: Secondary | ICD-10-CM | POA: Diagnosis not present

## 2024-02-04 DIAGNOSIS — G43009 Migraine without aura, not intractable, without status migrainosus: Secondary | ICD-10-CM | POA: Diagnosis not present

## 2024-02-04 DIAGNOSIS — E119 Type 2 diabetes mellitus without complications: Secondary | ICD-10-CM | POA: Diagnosis not present

## 2024-02-04 DIAGNOSIS — I10 Essential (primary) hypertension: Secondary | ICD-10-CM | POA: Diagnosis not present

## 2024-02-04 DIAGNOSIS — K589 Irritable bowel syndrome without diarrhea: Secondary | ICD-10-CM | POA: Diagnosis not present

## 2024-02-04 DIAGNOSIS — K219 Gastro-esophageal reflux disease without esophagitis: Secondary | ICD-10-CM | POA: Diagnosis not present

## 2024-02-04 DIAGNOSIS — D509 Iron deficiency anemia, unspecified: Secondary | ICD-10-CM | POA: Diagnosis not present

## 2024-02-29 DIAGNOSIS — J9610 Chronic respiratory failure, unspecified whether with hypoxia or hypercapnia: Secondary | ICD-10-CM | POA: Diagnosis not present

## 2024-02-29 DIAGNOSIS — J449 Chronic obstructive pulmonary disease, unspecified: Secondary | ICD-10-CM | POA: Diagnosis not present

## 2024-02-29 DIAGNOSIS — J9621 Acute and chronic respiratory failure with hypoxia: Secondary | ICD-10-CM | POA: Diagnosis not present

## 2024-03-04 DIAGNOSIS — J9621 Acute and chronic respiratory failure with hypoxia: Secondary | ICD-10-CM | POA: Diagnosis not present

## 2024-03-04 DIAGNOSIS — R5381 Other malaise: Secondary | ICD-10-CM | POA: Diagnosis not present

## 2024-03-04 DIAGNOSIS — G4733 Obstructive sleep apnea (adult) (pediatric): Secondary | ICD-10-CM | POA: Diagnosis not present

## 2024-03-15 DIAGNOSIS — F4310 Post-traumatic stress disorder, unspecified: Secondary | ICD-10-CM | POA: Diagnosis not present

## 2024-03-15 DIAGNOSIS — F339 Major depressive disorder, recurrent, unspecified: Secondary | ICD-10-CM | POA: Diagnosis not present

## 2024-03-15 DIAGNOSIS — F419 Anxiety disorder, unspecified: Secondary | ICD-10-CM | POA: Diagnosis not present

## 2024-03-15 DIAGNOSIS — G4730 Sleep apnea, unspecified: Secondary | ICD-10-CM | POA: Diagnosis not present

## 2024-03-30 DIAGNOSIS — J9621 Acute and chronic respiratory failure with hypoxia: Secondary | ICD-10-CM | POA: Diagnosis not present

## 2024-03-30 DIAGNOSIS — J9610 Chronic respiratory failure, unspecified whether with hypoxia or hypercapnia: Secondary | ICD-10-CM | POA: Diagnosis not present

## 2024-03-30 DIAGNOSIS — J449 Chronic obstructive pulmonary disease, unspecified: Secondary | ICD-10-CM | POA: Diagnosis not present

## 2024-04-03 DIAGNOSIS — J9621 Acute and chronic respiratory failure with hypoxia: Secondary | ICD-10-CM | POA: Diagnosis not present

## 2024-04-03 DIAGNOSIS — G4733 Obstructive sleep apnea (adult) (pediatric): Secondary | ICD-10-CM | POA: Diagnosis not present

## 2024-04-03 DIAGNOSIS — R5381 Other malaise: Secondary | ICD-10-CM | POA: Diagnosis not present

## 2024-04-15 DIAGNOSIS — F339 Major depressive disorder, recurrent, unspecified: Secondary | ICD-10-CM | POA: Diagnosis not present

## 2024-04-15 DIAGNOSIS — F419 Anxiety disorder, unspecified: Secondary | ICD-10-CM | POA: Diagnosis not present

## 2024-04-15 DIAGNOSIS — F4310 Post-traumatic stress disorder, unspecified: Secondary | ICD-10-CM | POA: Diagnosis not present

## 2024-04-15 DIAGNOSIS — G4730 Sleep apnea, unspecified: Secondary | ICD-10-CM | POA: Diagnosis not present

## 2024-04-30 DIAGNOSIS — J9610 Chronic respiratory failure, unspecified whether with hypoxia or hypercapnia: Secondary | ICD-10-CM | POA: Diagnosis not present

## 2024-04-30 DIAGNOSIS — J9621 Acute and chronic respiratory failure with hypoxia: Secondary | ICD-10-CM | POA: Diagnosis not present

## 2024-05-04 DIAGNOSIS — R5381 Other malaise: Secondary | ICD-10-CM | POA: Diagnosis not present

## 2024-05-04 DIAGNOSIS — J9621 Acute and chronic respiratory failure with hypoxia: Secondary | ICD-10-CM | POA: Diagnosis not present

## 2024-05-25 DIAGNOSIS — F339 Major depressive disorder, recurrent, unspecified: Secondary | ICD-10-CM | POA: Diagnosis not present

## 2024-05-25 DIAGNOSIS — F4310 Post-traumatic stress disorder, unspecified: Secondary | ICD-10-CM | POA: Diagnosis not present

## 2024-05-25 DIAGNOSIS — F419 Anxiety disorder, unspecified: Secondary | ICD-10-CM | POA: Diagnosis not present

## 2024-05-30 DIAGNOSIS — J449 Chronic obstructive pulmonary disease, unspecified: Secondary | ICD-10-CM | POA: Diagnosis not present

## 2024-05-30 DIAGNOSIS — J9621 Acute and chronic respiratory failure with hypoxia: Secondary | ICD-10-CM | POA: Diagnosis not present

## 2024-05-30 DIAGNOSIS — J9610 Chronic respiratory failure, unspecified whether with hypoxia or hypercapnia: Secondary | ICD-10-CM | POA: Diagnosis not present

## 2024-06-03 DIAGNOSIS — R5381 Other malaise: Secondary | ICD-10-CM | POA: Diagnosis not present

## 2024-06-03 DIAGNOSIS — G4733 Obstructive sleep apnea (adult) (pediatric): Secondary | ICD-10-CM | POA: Diagnosis not present

## 2024-06-03 DIAGNOSIS — J9621 Acute and chronic respiratory failure with hypoxia: Secondary | ICD-10-CM | POA: Diagnosis not present

## 2024-06-08 DIAGNOSIS — G629 Polyneuropathy, unspecified: Secondary | ICD-10-CM | POA: Diagnosis not present

## 2024-06-08 DIAGNOSIS — E039 Hypothyroidism, unspecified: Secondary | ICD-10-CM | POA: Diagnosis not present

## 2024-06-08 DIAGNOSIS — K219 Gastro-esophageal reflux disease without esophagitis: Secondary | ICD-10-CM | POA: Diagnosis not present

## 2024-06-08 DIAGNOSIS — K589 Irritable bowel syndrome without diarrhea: Secondary | ICD-10-CM | POA: Diagnosis not present

## 2024-06-08 DIAGNOSIS — E662 Morbid (severe) obesity with alveolar hypoventilation: Secondary | ICD-10-CM | POA: Diagnosis not present

## 2024-06-08 DIAGNOSIS — I10 Essential (primary) hypertension: Secondary | ICD-10-CM | POA: Diagnosis not present

## 2024-06-08 DIAGNOSIS — I890 Lymphedema, not elsewhere classified: Secondary | ICD-10-CM | POA: Diagnosis not present

## 2024-06-08 DIAGNOSIS — D509 Iron deficiency anemia, unspecified: Secondary | ICD-10-CM | POA: Diagnosis not present

## 2024-06-08 DIAGNOSIS — E119 Type 2 diabetes mellitus without complications: Secondary | ICD-10-CM | POA: Diagnosis not present

## 2024-06-08 DIAGNOSIS — G43009 Migraine without aura, not intractable, without status migrainosus: Secondary | ICD-10-CM | POA: Diagnosis not present

## 2024-07-26 ENCOUNTER — Ambulatory Visit
